# Patient Record
Sex: Female | Born: 1970 | Race: Black or African American | Hispanic: No | Marital: Married | Smoking: Former smoker
Health system: Southern US, Community
[De-identification: ages and names within clinical notes are randomized; demographics above are authoritative.]

## PROBLEM LIST (undated history)

## (undated) DIAGNOSIS — E119 Type 2 diabetes mellitus without complications: Secondary | ICD-10-CM

## (undated) DIAGNOSIS — S83209A Unspecified tear of unspecified meniscus, current injury, unspecified knee, initial encounter: Secondary | ICD-10-CM

## (undated) DIAGNOSIS — I10 Essential (primary) hypertension: Secondary | ICD-10-CM

---

## 1997-09-04 ENCOUNTER — Other Ambulatory Visit: Admission: RE | Admit: 1997-09-04 | Discharge: 1997-09-04 | Payer: Self-pay | Admitting: Obstetrics & Gynecology

## 1998-02-06 ENCOUNTER — Other Ambulatory Visit: Admission: RE | Admit: 1998-02-06 | Discharge: 1998-02-06 | Payer: Self-pay | Admitting: Obstetrics and Gynecology

## 1998-04-13 ENCOUNTER — Encounter: Admission: RE | Admit: 1998-04-13 | Discharge: 1998-07-12 | Payer: Self-pay | Admitting: Obstetrics and Gynecology

## 1998-05-16 ENCOUNTER — Observation Stay (HOSPITAL_COMMUNITY): Admission: RE | Admit: 1998-05-16 | Discharge: 1998-05-16 | Payer: Self-pay | Admitting: Obstetrics and Gynecology

## 1998-08-15 ENCOUNTER — Encounter: Payer: Self-pay | Admitting: Obstetrics and Gynecology

## 1998-08-15 ENCOUNTER — Inpatient Hospital Stay (HOSPITAL_COMMUNITY): Admission: AD | Admit: 1998-08-15 | Discharge: 1998-08-15 | Payer: Self-pay | Admitting: Obstetrics and Gynecology

## 1998-08-16 ENCOUNTER — Inpatient Hospital Stay (HOSPITAL_COMMUNITY): Admission: AD | Admit: 1998-08-16 | Discharge: 1998-08-16 | Payer: Self-pay | Admitting: Obstetrics and Gynecology

## 1998-08-30 ENCOUNTER — Inpatient Hospital Stay (HOSPITAL_COMMUNITY): Admission: AD | Admit: 1998-08-30 | Discharge: 1998-08-30 | Payer: Self-pay | Admitting: *Deleted

## 1998-09-04 ENCOUNTER — Inpatient Hospital Stay (HOSPITAL_COMMUNITY): Admission: AD | Admit: 1998-09-04 | Discharge: 1998-09-04 | Payer: Self-pay | Admitting: Obstetrics and Gynecology

## 1998-09-06 ENCOUNTER — Inpatient Hospital Stay (HOSPITAL_COMMUNITY): Admission: AD | Admit: 1998-09-06 | Discharge: 1998-09-09 | Payer: Self-pay | Admitting: Obstetrics and Gynecology

## 1998-09-11 ENCOUNTER — Encounter (HOSPITAL_COMMUNITY): Admission: RE | Admit: 1998-09-11 | Discharge: 1998-12-10 | Payer: Self-pay | Admitting: Obstetrics and Gynecology

## 1998-09-16 ENCOUNTER — Inpatient Hospital Stay (HOSPITAL_COMMUNITY): Admission: AD | Admit: 1998-09-16 | Discharge: 1998-09-16 | Payer: Self-pay | Admitting: Obstetrics and Gynecology

## 1998-11-06 ENCOUNTER — Other Ambulatory Visit: Admission: RE | Admit: 1998-11-06 | Discharge: 1998-11-06 | Payer: Self-pay | Admitting: Obstetrics and Gynecology

## 1998-11-07 ENCOUNTER — Encounter (INDEPENDENT_AMBULATORY_CARE_PROVIDER_SITE_OTHER): Payer: Self-pay

## 1998-11-07 ENCOUNTER — Other Ambulatory Visit: Admission: RE | Admit: 1998-11-07 | Discharge: 1998-11-07 | Payer: Self-pay | Admitting: Obstetrics and Gynecology

## 1999-06-06 ENCOUNTER — Inpatient Hospital Stay (HOSPITAL_COMMUNITY): Admission: AD | Admit: 1999-06-06 | Discharge: 1999-06-06 | Payer: Self-pay | Admitting: *Deleted

## 1999-10-30 ENCOUNTER — Other Ambulatory Visit: Admission: RE | Admit: 1999-10-30 | Discharge: 1999-10-30 | Payer: Self-pay | Admitting: Obstetrics and Gynecology

## 1999-12-04 ENCOUNTER — Encounter (INDEPENDENT_AMBULATORY_CARE_PROVIDER_SITE_OTHER): Payer: Self-pay | Admitting: Specialist

## 1999-12-04 ENCOUNTER — Other Ambulatory Visit: Admission: RE | Admit: 1999-12-04 | Discharge: 1999-12-04 | Payer: Self-pay | Admitting: Obstetrics and Gynecology

## 1999-12-11 ENCOUNTER — Inpatient Hospital Stay (HOSPITAL_COMMUNITY): Admission: AD | Admit: 1999-12-11 | Discharge: 1999-12-11 | Payer: Self-pay | Admitting: Obstetrics and Gynecology

## 1999-12-12 ENCOUNTER — Encounter (INDEPENDENT_AMBULATORY_CARE_PROVIDER_SITE_OTHER): Payer: Self-pay

## 1999-12-12 ENCOUNTER — Ambulatory Visit (HOSPITAL_COMMUNITY): Admission: RE | Admit: 1999-12-12 | Discharge: 1999-12-12 | Payer: Self-pay | Admitting: Obstetrics and Gynecology

## 1999-12-31 ENCOUNTER — Encounter (INDEPENDENT_AMBULATORY_CARE_PROVIDER_SITE_OTHER): Payer: Self-pay

## 1999-12-31 ENCOUNTER — Other Ambulatory Visit: Admission: RE | Admit: 1999-12-31 | Discharge: 1999-12-31 | Payer: Self-pay | Admitting: Obstetrics and Gynecology

## 2000-04-24 ENCOUNTER — Other Ambulatory Visit: Admission: RE | Admit: 2000-04-24 | Discharge: 2000-04-24 | Payer: Self-pay | Admitting: *Deleted

## 2000-09-24 ENCOUNTER — Other Ambulatory Visit: Admission: RE | Admit: 2000-09-24 | Discharge: 2000-09-24 | Payer: Self-pay | Admitting: Obstetrics and Gynecology

## 2001-05-05 ENCOUNTER — Other Ambulatory Visit: Admission: RE | Admit: 2001-05-05 | Discharge: 2001-05-05 | Payer: Self-pay | Admitting: Obstetrics and Gynecology

## 2001-05-07 ENCOUNTER — Encounter (INDEPENDENT_AMBULATORY_CARE_PROVIDER_SITE_OTHER): Payer: Self-pay

## 2001-05-07 ENCOUNTER — Ambulatory Visit (HOSPITAL_COMMUNITY): Admission: RE | Admit: 2001-05-07 | Discharge: 2001-05-07 | Payer: Self-pay | Admitting: Obstetrics and Gynecology

## 2002-02-10 HISTORY — PX: HERNIA REPAIR: SHX51

## 2002-02-10 HISTORY — PX: ABDOMINAL HYSTERECTOMY: SHX81

## 2002-03-19 ENCOUNTER — Encounter: Payer: Self-pay | Admitting: Obstetrics & Gynecology

## 2002-03-19 ENCOUNTER — Encounter (INDEPENDENT_AMBULATORY_CARE_PROVIDER_SITE_OTHER): Payer: Self-pay

## 2002-03-19 ENCOUNTER — Ambulatory Visit (HOSPITAL_COMMUNITY): Admission: AD | Admit: 2002-03-19 | Discharge: 2002-03-19 | Payer: Self-pay | Admitting: Obstetrics & Gynecology

## 2002-03-21 ENCOUNTER — Ambulatory Visit (HOSPITAL_COMMUNITY): Admission: AD | Admit: 2002-03-21 | Discharge: 2002-03-21 | Payer: Self-pay | Admitting: Obstetrics and Gynecology

## 2002-03-21 ENCOUNTER — Encounter: Payer: Self-pay | Admitting: Obstetrics and Gynecology

## 2002-03-22 ENCOUNTER — Encounter (INDEPENDENT_AMBULATORY_CARE_PROVIDER_SITE_OTHER): Payer: Self-pay | Admitting: *Deleted

## 2002-04-19 ENCOUNTER — Encounter (HOSPITAL_COMMUNITY): Admission: RE | Admit: 2002-04-19 | Discharge: 2002-07-18 | Payer: Self-pay | Admitting: Nephrology

## 2002-10-26 ENCOUNTER — Other Ambulatory Visit: Admission: RE | Admit: 2002-10-26 | Discharge: 2002-10-26 | Payer: Self-pay | Admitting: Obstetrics and Gynecology

## 2002-11-04 ENCOUNTER — Ambulatory Visit (HOSPITAL_COMMUNITY): Admission: RE | Admit: 2002-11-04 | Discharge: 2002-11-04 | Payer: Self-pay | Admitting: Obstetrics and Gynecology

## 2002-11-04 ENCOUNTER — Encounter (INDEPENDENT_AMBULATORY_CARE_PROVIDER_SITE_OTHER): Payer: Self-pay | Admitting: *Deleted

## 2003-01-16 ENCOUNTER — Encounter (INDEPENDENT_AMBULATORY_CARE_PROVIDER_SITE_OTHER): Payer: Self-pay | Admitting: *Deleted

## 2003-01-16 ENCOUNTER — Inpatient Hospital Stay (HOSPITAL_COMMUNITY): Admission: RE | Admit: 2003-01-16 | Discharge: 2003-01-19 | Payer: Self-pay | Admitting: Obstetrics and Gynecology

## 2003-03-23 ENCOUNTER — Emergency Department (HOSPITAL_COMMUNITY): Admission: AD | Admit: 2003-03-23 | Discharge: 2003-03-23 | Payer: Self-pay | Admitting: Family Medicine

## 2004-05-23 ENCOUNTER — Encounter: Admission: RE | Admit: 2004-05-23 | Discharge: 2004-08-21 | Payer: Self-pay | Admitting: Nephrology

## 2005-04-13 ENCOUNTER — Emergency Department (HOSPITAL_COMMUNITY): Admission: AD | Admit: 2005-04-13 | Discharge: 2005-04-13 | Payer: Self-pay | Admitting: Family Medicine

## 2005-10-18 ENCOUNTER — Emergency Department (HOSPITAL_COMMUNITY): Admission: EM | Admit: 2005-10-18 | Discharge: 2005-10-18 | Payer: Self-pay | Admitting: Emergency Medicine

## 2007-11-29 ENCOUNTER — Emergency Department (HOSPITAL_COMMUNITY): Admission: EM | Admit: 2007-11-29 | Discharge: 2007-11-29 | Payer: Self-pay | Admitting: Emergency Medicine

## 2007-12-06 ENCOUNTER — Encounter: Admission: RE | Admit: 2007-12-06 | Discharge: 2007-12-06 | Payer: Self-pay | Admitting: Internal Medicine

## 2008-04-28 ENCOUNTER — Encounter: Admission: RE | Admit: 2008-04-28 | Discharge: 2008-04-28 | Payer: Self-pay | Admitting: Obstetrics and Gynecology

## 2009-10-26 ENCOUNTER — Emergency Department (HOSPITAL_COMMUNITY): Admission: EM | Admit: 2009-10-26 | Discharge: 2009-10-26 | Payer: Self-pay | Admitting: Family Medicine

## 2010-06-28 NOTE — H&P (Signed)
NAME:  Tara Stevens, Tara Stevens                    ACCOUNT NO.:  [id number]   MEDICAL RECORD NO.:  [id number]                   PATIENT TYPE:  INP   LOCATION:  NA                                   FACILITY:  [facility]   PHYSICIAN:  Maxie Better, M.D.            DATE OF BIRTH:  1970/06/16   DATE OF ADMISSION:  01/16/2003  DATE OF DISCHARGE:                                HISTORY & PHYSICAL   CHIEF COMPLAINT:  Dysfunctional uterine bleeding/menometrorrhagia refractory  to hormone therapy, scheduled for total abdominal hysterectomy.   HISTORY OF PRESENT ILLNESS:  This is a 40 year old gravida 5, para 2-0-3-2,  married black female who is now being admitted for total abdominal  hysterectomy secondary to dysfunctional uterine bleeding and  menometrorrhagia refractory to hormonal therapy and D&C with hysteroscopy  x2.  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  The patient underwent a D&C hysteroscopy  on November 04, 2002.  Findings at the time were endometrial polyps and a  disordered proliferative endometrium. The patient has been tried on oral  contraception for bleeding control, progestational agent without much  success.  Ultrasound on August 11, 2002, showed the uterus to contain multiple  uterine fibroids, a 2.2 cm right simple ovarian cyst, and a normal left  ovary at that time.  The patient has had a previous D&C hysteroscopy with  removal of endometrial polyps in March 2003.  The patient has noted  irregular heavy vaginal bleeding.  Her flow would last greater than seven  days with associated large clots, minimal cramping.  The patient has had  some hot flashes.  The patient has now decided to  proceed with definitive  management.   PAST MEDICAL HISTORY:  Allergy to PENICILLIN.   Medical history:  Chronic hypertension, polycystic ovarian syndrome, iron-  deficiency anemia, nephrotic syndrome, uterine fibroids, history of  gestational diabetes x2, exogenous obesity.   Surgery:  Cesarean section x2, D&C hysteroscopy 2001, 2004, LEEP procedure  November 2001, D&E x2, 1993 and December 2000.  D&C in 1995, cerclage  placement 2000.   PAST OBSTETRICAL HISTORY:  C-section x2.  TAB x1.  MAB x1.  SAB x1.   GYNECOLOGIC HISTORY:  Uterine fibroids, anovulation requiring Clomid for  conception, severe cervical dysplasia 2001.  Polycystic ovarian syndrome,  for which the patient was placed on Glucophage.   MEDICATIONS:  Lasix, Lotensin, hydrochlorothiazide, Glucophage, iron,  vitamin B12, multivitamin.   FAMILY HISTORY:  Mother and maternal grandmother have passed.  No colon,  ovarian, or breast cancer noted.  Maternal aunt died of cervical cancer.  Mother had hypertension, as did maternal aunt, maternal great-grandmother,  and father.  MI in the mother and a maternal aunt.  Mother and maternal aunt  with cardiomyopathy.   SOCIAL HISTORY:  Married, two children, nonsmoker.  Self-employed.   REVIEW OF SYSTEMS:  Negative except for as per HPI.  Nipple discharge noted  in September 2004.   PHYSICAL EXAMINATION:  GENERAL:  A well-developed, well-nourished black  female in no acute distress.  VITAL SIGNS:  Blood pressure 144/98, temperature of 98.1, weight is 261  pounds.  Height 5 feet 5 inches.  SKIN:  No lesions.  HEENT:  Anicteric sclerae, pale pink conjunctivae.  Oropharynx negative.  CARDIAC:  Heart was regular rate and rhythm without murmur.  CHEST:  Lungs were clear to auscultation.  BREASTS:  Soft, nontender, no palpable mass.  NECK:  Supple, thyroid not palpable.  Posterior neck with acanthosis  nigricans.  ABDOMEN:  Obese, soft, no hernia noted.  Transverse incision.   Pannus.  No  organomegaly.  BACK:  No CVA tenderness.  LYMPHATIC:  No supraclavicular, axillary, or inguinal nodes palpable.  PELVIC:  Vulva shows no lesions.  Vagina with a relaxed introitus.  Cervix  was parous, high in the vault.  Uterus is bulky, anteverted, about 12 weeks'  size.  Adnexa not palpable.  Limited by the patient's body habitus.  RECTAL:  Deferred.  EXTREMITIES:  1+ edema.   LABORATORY DATA:  Pap in September 2004 was within normal limits.   IMPRESSION:  1. Dysfunctional uterine bleeding refractory to hormonal therapy and     surgical management.  2. Chronic hypertension.  3. Nephrotic syndrome.  4. Exogenous obesity.   PLAN:  Admission for total abdominal hysterectomy with preservation of  ovarian function.  Antibiotic prophylaxis, antiembolic stockings.  Risks of  the procedure have been explained to the patient, including but not limited  to infection, bleeding, injury to surrounding organ structures such as the  bladder, bowel, or ureter, fistula formation, possible need for ovarian  surgery in the future secondary to ovarian cyst, ovarian cancer, etc.,  internal scar tissue which could lead to bowel obstruction, and pelvic pain.  Risks of blood transfusion including HIV transfusion, hepatitis  transmission, acute reaction were discussed.  Need for continued Pap smears  despite removal of the uterus was discussed.  Postop care and criteria for  discharge were reviewed.  Will continue antihypertensive medications  postoperatively.  All questions answered.                                               Maxie Better, M.D.    [place]/MEDQ  D:  01/15/2003  T:  01/16/2003  Job:  [id number]

## 2010-06-28 NOTE — Op Note (Signed)
[name], Tara Stevens                    ACCOUNT NO.:  [id number]   MEDICAL RECORD NO.:  [id number]                   PATIENT TYPE:  AMB   LOCATION:  SDC                                  FACILITY:  [facility]   PHYSICIAN:  Maxie Better, M.D.            DATE OF BIRTH:  15-Jun-1970   DATE OF PROCEDURE:  11/04/2002  DATE OF DISCHARGE:                                 OPERATIVE REPORT   PREOPERATIVE DIAGNOSES:  1. Dysfunctional uterine bleeding.  2. Question submucosal fibroid.   PROCEDURES:  1. Diagnostic hysteroscopy.  2. Resection of endometrial polyps.  3. Dilation and curettage.   POSTOPERATIVE DIAGNOSES:  1. Dysfunctional uterine bleeding.  2. Endometrial polyps.   ANESTHESIA:  General.   SURGEON:  Maxie Better, M.D.   INDICATIONS:  This is a 40 year old female with anovulatory cycles who has  prolonged and heavy menses and who on ultrasound was found to have a  possible submucosal mass and who now presents for surgical management.  The  patient does desire to conceive.  The risks and benefits of the procedure  had been explained to the patient, consent was signed, and the patient was  transferred to the operating room.   DESCRIPTION OF PROCEDURE:  Under adequate general anesthesia, the patient  was placed in the dorsal lithotomy position.  She was sterilely prepped and  draped in the usual fashion.  The bladder was catheterized for a large  amount of urine.  Examination under anesthesia revealed a 12-week size  anteverted uterus, no adnexal masses were appreciated; however, the exam was  limited by the patient's body habitus.  A bivalve speculum was placed in the  vagina, a single-tooth tenaculum was placed on the anterior lip of the  cervix.  The cervix was then serially dilated up to a #31 Pratt dilator.  A  glycine-primed resectoscope with a double loop was introduced into the  uterine cavity without incident.  A large amount of polypoid lesions were  noted on the right, left, fundal, and posterior aspects of the endometrial  cavity.  The resectoscope was then used to resect the polyps and the  resectoscope was then removed.  The cavity was then curetted, resectoscope  reintroduced.  Additional polyps that were noted, particularly on the left  anterior aspect of the uterine cavity, were also resected.  During this time  the right tubal ostium was seen, the left could not be seen.  There was no  evidence or suggestion of a submucosal fibroid.  The endocervical canal was  subsequently inspected and no polypoid lesions noted.  With the cavity  showing no other polypoid lesions, the resectoscope was removed, the cavity  was not curetted further.  Specimen labeled endometrial  curetting and endometrial polyps was sent to pathology.  Estimated blood  loss was minimal.  Fluid deficit was 330 mL.  Complication was none.  The  patient tolerated the procedure well and was transferred to the recovery  room in stable condition.                                               Maxie Better, M.D.    [place]/MEDQ  D:  11/04/2002  T:  11/07/2002  Job:  [id number]

## 2010-06-28 NOTE — Op Note (Signed)
NAMEDON, GIARRUSSO                    ACCOUNT NO.:  [id number]   MEDICAL RECORD NO.:  [id number]                   PATIENT TYPE:  INP   LOCATION:  9302                                 FACILITY:  [facility]   PHYSICIAN:  Maxie Better, M.D.            DATE OF BIRTH:  08/16/1970   DATE OF PROCEDURE:  01/16/2003  DATE OF DISCHARGE:                                 OPERATIVE REPORT   PREOPERATIVE DIAGNOSES:  1. Dysfunctional uterine bleeding/menometrorrhagia.  2. Uterine fibroids.   PROCEDURES:  1. Exploratory laparotomy.  2. Total abdominal hysterectomy.  3. Bilateral inguinal herniorrhaphy.   POSTOPERATIVE DIAGNOSES:  1. Dysfunctional uterine bleeding/menorrhagia.  2. Uterine fibroids.  3. Bilateral inguinal hernias.   ANESTHESIA:  General.   SURGEON:  Maxie Better, M.D.   ASSISTANT:  Pershing Cox, M.D.   DESCRIPTION OF PROCEDURE:  Under adequate general anesthesia, the patient  was placed in the supine position.  She was sterilely prepped and draped in  the usual fashion for a hysterectomy.  An indwelling Foley catheter was  placed.  The patient's pannus was pulled upward.  Marcaine 0.25% was  injected along the previous Pfannenstiel skin incision.  A Pfannenstiel skin  incision was then made, carried down to the rectus fascia.  The rectus  fascia was incised in the midline and extended bilaterally.  The  architecture of the rectus fascia was distorted due to two previous  surgeries.  Nonetheless, after careful dissection the rectus muscle was  dissected off  the rectus fascia transversely and inferiorly.  At the time  of the dissection inferiorly, the left inguinal canal was noted to have a  bulge consistent with an inguinal hernia.  The parietal peritoneum was  incidentally opened as part of the dissection of the rectus fascia  superiorly, and this was then utilized to further carefully dissect  inferiorly and superiorly.  The upper abdomen was  inspected, a normal liver  edge palpable, and normal kidneys palpable.  A self-retaining Balfour  retractor was then placed.  The bowel was packed upwardly.  The uterus was  placed on traction.  The uterus was noted to be about 12 weeks' size.  Both  ovaries were enlarged but otherwise normal.  Both tubes were normal.  There  were small adhesions of the vesicouterine peritoneum on the right.  The  round ligament was bilaterally suture ligated with 0 Vicryl suture and  severed using cautery.  The proximal portions of the round ligament  bilaterally were suture ligated for hemostasis.  The anterior leaf of the  broad ligament was then opened transversely.  The bladder was then bluntly  and sharply dissected off the lower uterine segment and displaced  inferiorly.  The posterior leaf of the broad ligament was opened on the  right.  The utero-ovarian ligament on the right was doubly clamped, cut,  free-tied with 0 Vicryl, and suture ligated with 0 Vicryl suture.  The right  uterine vessels  were then skeletonized.  The uterine vessels on the right  were then doubly clamped, cut, and suture ligated with 0 Vicryl x2.  The  same procedure was performed on the contralateral side.  The cardinal  ligaments were then bilaterally serially clamped, cut, and suture ligated  with 0 Vicryl.  The uterosacral ligaments were identified and bilaterally  clamped, cut, and suture ligated with 0 Vicryl.  This procedure was  continued until the cervicovaginal junction was then reached, at which time  the right angle of the cervicovaginal junction was then clamped, cut,  confirming entry into the vagina, and severing of the cervical attachment to  the vagina.  The remaining portion of the attached cervix was then  circumferentially removed.  The right angle was then suture ligated with a  Heaney suture of 0 Vicryl.  The left angle was then angled with 0 Vicryl  using a modified Richardson stitch.  The vaginal cuff  was then reefed with 0  Vicryl running locked stitch circumferentially.  The posterior aspect of the  vaginal cuff was then also approximated with 2-0 Vicryl suture due to  peritoneal disruption posteriorly.  The bladder had continued to be  displaced inferiorly and small bleeders cauterized.  The vaginal cuff was  then closed an interrupted 0 Vicryl figure-of-eight sutures in a vertical  fashion.  Both ureters were identified and noted to be peristalsing and  normal in caliber with good hemostasis noted and all pedicles identified to  be okay.  The uterosacral ligaments were then attached to the angle sutures  bilaterally.  The round ligament was then used to suspend the ovarian  pedicles bilaterally.  The abdomen was then further inspected and irrigated,  suctioned of debris.  Good hemostasis was noted throughout.  The self-  retaining Balfour retractor and the bladder blade were removed, and  attention was then turned to the inguinal area.  Both inguinal canals were  inspected.  The left was larger than the right.  The defect in the inguinal  canal was approximated bilaterally using 0 Novofil sutures, interrupted  figure-of-eights.  At that point all packings were then removed from the  abdomen.  Bleeders were cauterized.  The parietal peritoneum was not closed.  The rectus fascia was closed with 0 Vicryl x2.  The subcutaneous area was  irrigated, suctioned, and bleeders cauterized.  The subcutaneous area was  approximated with interrupted 3-0 plain sutures.  The skin was approximated  using Ethicon staples.   SPECIMENS:  Uterus and cervix.   ESTIMATED BLOOD LOSS:  200 mL.   INTRAOPERATIVE FLUIDS REPLACED:  2400 mL crystalloid.   URINE OUTPUT:  150 mL clear yellow urine.   Sponge and instrument count x2 was correct.   COMPLICATIONS:  None.   The patient tolerated the procedure well, was transferred to the recovery  room in stable condition.                                               Maxie Better, M.D.    [place]/MEDQ  D:  01/16/2003  T:  01/17/2003  Job:  [id number]

## 2010-06-28 NOTE — Discharge Summary (Signed)
NAMECHANDELL, ATTRIDGE                    ACCOUNT NO.:  [id number]   MEDICAL RECORD NO.:  [id number]                   PATIENT TYPE:  INP   LOCATION:  9302                                 [facility]ACILITY:  [facility]   PHYSICIAN:  Maxie Better, M.D.            DATE OF BIRTH:  1970-09-07   DATE OF ADMISSION:  01/16/2003  DATE OF DISCHARGE:  01/19/2003                                 DISCHARGE SUMMARY   ADMISSION DIAGNOSES:  1. Dysfunction uterine bleeding.  2. Chronic hypertension.  3. Nephrotic syndrome.  4. Exogenous obesity.  5. Uterine fibroids.   DISCHARGE DIAGNOSES:  1. Dysfunction uterine bleeding.  2. Extensive adenomyosis.  3. Uterine fibroids.  4. Bilateral inguinal hernias.   PROCEDURES:  1. Total abdominal hysterectomy.  2. Bilateral inguinal herniorrhaphy.   HISTORY OF PRESENT ILLNESS:  This is a 40 year old, gravida 5, para 2-0-3-2,  married black female admitted for a total abdominal hysterectomy secondary  to dysfunctional uterine bleeding refractory to hormonal therapy, and D&C  with hysteroscopy x2.  Please see the dictated H&P for the specific details.   HOSPITAL COURSE:  The patient was admitted to [facility].  She was  taken to the operating room where she underwent a total abdominal  hysterectomy.  At the time of the surgery, she was found to have bilateral  inguinal hernias.  Her uterus was about 12 weeks size.  Large ovaries were  noted bilaterally.  Normal tubes were otherwise noted.  Normal upper  abdomen.  The final pathology revealed extensive adenomyosis, intramural  leioma of 1 cm, subserosal leiomyoma of 0.8 cm.  Her postoperative course  was complicated by a postoperative temperature of a maximum of 100.7  secondary to atelectasis.  Urinalysis and urine cultures were obtained.  Urine culture showed multiple species.  A CBC on postoperative day #1 showed  a hemoglobin of 10.2, hematocrit of 31.2, white count of 8.3.  The patient  was given  Dulcolax suppository for slow bowel function.  By postoperative  day #3, the patient had been afebrile for more than 24 hours, she had  tolerated a regular diet, she had passed flatus, her incision showed no  erythema, induration, or exudate, and the staples were in place.  The  patient was anxious to go home, and she was deemed well to be discharged.   DISPOSITION:  Home.   CONDITION:  Stable.   DISCHARGE MEDICATIONS:  1. Tylox, #30, 1-2 tablets q.3-4h. p.r.n. pain.  2. The patient was to continue her antihypertensive medication.   DISCHARGE TEACHING:  1. Call for temperature greater than or equal to 100.4.  2. Nothing per vagina for 4-6 weeks.  3. No heavy lifting.  4. No driving for 2 weeks.  5. Call for increased incisional pain, drainage from the incision site,     ____________ more frequently.  6. No straining with her bowel movements.   FOLLOW UP:  1. Follow up appointment is with OB/GYN in  4-6 weeks for routine     postoperative care.  2. With __________ on January 23, 2003.                                               Maxie Better, M.D.    /MEDQ  D:  03/03/2003  T:  03/04/2003  Job:  [id number]

## 2010-06-28 NOTE — Op Note (Signed)
[facility]  Patient:    Tara Stevens, Tara Stevens Visit Number: [id number] MRN: [id number]          Service Type: DSU Location: [facility] Attending Physician:  Maxie Better Dictated by:   Sheria Lang. Cherly Hensen, M.D. Proc. Date: 05/07/01 Admit Date:  05/07/2001 Disc. Date: 05/09/01                             Operative Report  PREOPERATIVE DIAGNOSES:       Dysfunctional uterine bleeding, endometrial mass.  PROCEDURE:                    Diagnostic hysteroscopy, resection of possible endometrial polyps, dilatation and curettage.  POSTOPERATIVE DIAGNOSES:      Dysfunctional uterine bleeding, possible endometrial polyps, adenomyosis.  ANESTHESIA:                   General.  SURGEON:                      Sheronette A. Cherly Hensen, M.D.  HISTORY:                      This is a 40 year old para 2 female with history of prolonged anovulatory bleeding resulting in severe iron deficiency anemia who on sonohysterogram was found to have an endometrial mass and who now presents for further evaluation.  Risks and benefits of procedure have been explained to the patient.  Consent was signed.  The patient had had a previous D&C, hysteroscopic removal of endometrial polyp approximately two years ago in a similar setting.  PROCEDURE:                    Under adequate general anesthesia the patient was placed in the dorsal lithotomy position.  Examination under anesthesia revealed a bulky anteverted 10 week sized uterus.  No adnexal masses could be appreciated.  The patient was sterilely prepped and draped in the usual fashion.  The bladder was catheterized for a small amount of urine.  Bivalve speculum was placed in the vagina.  Single tooth tenaculum was placed in the anterior lip of the cervix.  The cervix was easily dilated up to a #31 Pratt dilator.  A resectoscope primed with fluid was introduced into the uterine cavity without difficulty.  Inspection of the  cavity was notable for both tubal ostia being seen.  There was a lot of polypoid looking tissue fundally, posteriorly on the left and on the right anterior surface of the uterus. There was also some gland openings noted on the fundal area suggestive of adenomyosis and less likely scarring, although the patient has had previous cesarean section.  Using the resectoscope the polypoid masses were removed. The resectoscope was removed, cavity curetted, and resectoscope reinserted. The cavity was inspected and additional polypoid lesions were then gently removed.  Cavity once again curetted.  The endocervical canal was inspected. No polypoid masses noted at which time the cavity felt slightly irregular anteriorly.  There was no evidence of a submucosal fibroid that could be visualized.  Decision was then made to terminate the procedure by removing all instruments from the vagina.  Specimen labeled endometrial curetting and endometrial polyp was sent to pathology.  Estimated blood loss was minimal. Complications were none.  The patient tolerated the procedure well.  Was transferred to the recovery room in stable condition. Dictated by:   Nena Jordan  Cathie Beams, M.D. Attending Physician:  Maxie Better DD:  05/07/01 TD:  05/09/01 Job: [id number]

## 2010-06-28 NOTE — Op Note (Signed)
   [name], Tara Stevens                    ACCOUNT NO.:  [id number]   MEDICAL RECORD NO.:  [id number]                   PATIENT TYPE:  AMB   LOCATION:  MATC                                 FACILITY:  [facility]   PHYSICIAN:  Lenoard Aden, M.D.             DATE OF BIRTH:  10/21/1970   DATE OF PROCEDURE:  03/22/2002  DATE OF DISCHARGE:                                 OPERATIVE REPORT   PREOPERATIVE DIAGNOSIS:  Retained products of conception status post  dilatation and evacuation on March 19, 2002.   POSTOPERATIVE DIAGNOSIS:  Retained products of conception status post  dilatation and evacuation on March 19, 2002.   PROCEDURE:  Suction dilatation and evacuation.   SURGEON:  Lenoard Aden, M.D.   ANESTHESIA:  MAC, paracervical.   ESTIMATED BLOOD LOSS:  100 mL.   COMPLICATIONS:  None.   DRAINS:  None.   COUNTS:  Correct.   DISPOSITION:  The patient was brought to the recovery room in good  condition.   DESCRIPTION OF PROCEDURE:  After achieving adequate anesthesia, the patient  was brought to the operating room where she was administered IV sedation,  prepped and draped in the usual sterile fashion.  Examination under  anesthesia reveals an 8-10 week size uterus, anterior fibroid palpable after  placement of 2% Xylocaine solution for paracervical block, 20 mL.  The  uterus sounds up to 15 cm, deviated to the right.  A 8 mm suction curet was  placed after dilating the uterus with a 25 Pratt dilator.  Suction reveals  adequate tissue and palpable products of conception.  Repeat suction and  curettage confirms curettage in a four quadrant method and reveals the  catheter to empty.  Cefotan 2 g given IM during the procedure.  Good  hemostasis is noted.  All instruments were removed.  The patient tolerated  the procedure well and went to recovery in good condition.                                                Lenoard Aden, M.D.    [facility]/MEDQ  D:   03/22/2002  T:  03/22/2002  Job:  [id number]

## 2010-06-28 NOTE — Op Note (Signed)
[facility]  Patient:    Tara Stevens, Tara Stevens                 MRN: [id number] Proc. Date: 12/12/99 Adm. Date:  [id number] Disc. Date: [id number] Attending:  Silverio Lay A                           Operative Report  PREOPERATIVE DIAGNOSES:       Endometrial polyp, anovulatory bleeding, severe iron deficiency anemia.  PROCEDURE:                    Diagnostic hysteroscopy with resection of endometrial polyp, dilation and curettage.  POSTOPERATIVE DIAGNOSES:      Endometrial polyp, anovulatory bleeding, severe iron deficiency anemia.  ANESTHESIA:                   General.  SURGEON:                      Sheronette A. Cherly Hensen, M.D.  INDICATIONS:                  This is a 40 year old para 2 female with the last normal menstrual period in April 2001 who had prolonged and excessive bleeding in July for a month and who on evaluation was found to have an endometrial mass in the fundal area and evaluation that also revealed a hemoglobin of 6.7.  The patient now presents for D&C, hysteroscopy, and removal of endometrial polyp.  She had been given 2 units of packed red cells on December 11, 1999.  Her hemoglobin rose to 8.1.  Her anemia work-up confirmed iron deficiency anemia with a ferritin level of 5.  The patients symptoms have been some lightheadedness and headache which she thought was sinus related.  Risks and benefits of procedure have been explained to the patient and her husband.  Consent was signed.  Patient was transferred to the operating room.  PROCEDURE:                    Under adequate general anesthesia the patient was placed in the dorsal lithotomy position.  Examination under anesthesia revealed a uterus that was about 10 weeks size.  No adnexal masses could be appreciated.  The patient was sterilely prepped and draped in the usual fashion.  The bladder was catheterized for a moderate amount of urine. Vivelle speculum was placed in the  vagina.  Single tooth tenaculum was placed on the anterior lip of the cervix.  The cervix accepted a 25 Pratt dilator.  A diagnostic hysteroscope to which a video camera was attached was introduced into the uterine cavity.  Inspection of the cavity was notable for both tubal ostia being seen, a polypoid mass arising from the fundus, and a very thickened and polypoidish posterior uterine wall.  No endocervical canal lesions noted.  The diagnostic hysteroscope was removed.  The cavity was curetted for a large amount of tissue.  Reinsertion of the diagnostic hysteroscope was noted for still polypoid lesions remaining, however, inadequate distention of the uterine cavity resulted in the diagnostic hysteroscope being removed.  A #31 Pratt dilator easily went into the cervical os and a resectoscope was inserted and the cavity was inspected.  Areas of thickening and what appears to be endometrial polyps were removed.  The resectoscope was removed, cavity recuretted, and reexamined.  When all polypoid appearing masses and thickening had  been removed without creating scarring in the uterine cavity, the instruments were all removed from the vagina.  SPECIMEN:                     Endometrial curetting with polyp.  ESTIMATED BLOOD LOSS:         Minimal.  COMPLICATIONS:                None.                                The patient tolerated procedure well and was transferred to the recovery room in good condition. DD:  12/12/99 TD:  12/13/99 Job: [id number]

## 2010-06-28 NOTE — Op Note (Signed)
NAMEELIZEBETH, Tara Stevens                    ACCOUNT NO.:  [id number]   MEDICAL RECORD NO.:  [id number]                   PATIENT TYPE:  AMB   LOCATION:  SDC                                  FACILITY:  [facility]   PHYSICIAN:  Genia Del, M.D.             DATE OF BIRTH:  October 12, 1970   DATE OF PROCEDURE:  03/19/2002  DATE OF DISCHARGE:                                 OPERATIVE REPORT   PREOPERATIVE DIAGNOSES:  1. Missed abortion, eight plus weeks' gestation per ultrasound, fetal heart     rate negative.  2. History of repetitive missed abortion.  3. Uterine myomas.   POSTOPERATIVE DIAGNOSES:  1. Missed abortion, eight plus weeks' gestation per ultrasound, fetal heart     rate negative.  2. History of repetitive missed abortion.  3. Uterine myomas.   PROCEDURE:  Dilatation and evacuation.   SURGEON:  Genia Del, M.D.   ANESTHESIOLOGIST:  Burnett Corrente, M.D.   DESCRIPTION OF PROCEDURE:  Under MAC analgesia, the patient was in lithotomy  position.  She is prepped Hibiclens on the suprapubic, vulvar, and vaginal  areas, the bladder is catheterized, and the patient is draped as usual.  The  vaginal exam revealed an irregular, increased-volume anteverted uterus,  mobile, no adnexal mass.  The cervix is closed, no active bleeding.  The  speculum is inserted.  The paracervical block is done with lidocaine 1% a  total of 20 mL at 4 o'clock and 8 o'clock.  We then grasp the anterior lip  of the cervix with a tenaculum.  Dilatation is done with Hegar dilators up  to #31 without difficulty.  We then use a #8 curved suction curette.  Products of conception are brought out corresponding to about eight to nine  weeks' gestation.  We then use a #9 curved curette to assure better  evacuation of the intrauterine cavity, which is slightly larger than average  probably due to uterine myomas.  We then use the sharp curette to  symmetrically curettage the entire intrauterine cavity on  all surfaces.  The  uterine sound is heard all over.  The uterus contracts well by the end of  the intervention.  The estimated blood loss was 50 mL.  The instruments were  removed.  A vaginal exam after surgery revealed a well-contracted uterus,  good hemostasis.  No complication occurred, and the patient was transferred  to recovery room in good status.  Her blood group was Rh positive.  Note  that part of the specimen was sent for genetic testing because of repetitive  missed abortion.  The rest were sent to pathology.                                                  Genia Del, M.D.    ML/MEDQ  D:  03/20/2002  T:  03/20/2002  Job:  [id number]

## 2010-06-28 NOTE — H&P (Signed)
Tara Stevens, Tara Stevens                    ACCOUNT NO.:  [id number]   MEDICAL RECORD NO.:  [id number]                   PATIENT TYPE:  AMB   LOCATION:  SDC                                  FACILITY:  [facility]   PHYSICIAN:  Maxie Better, M.D.            DATE OF BIRTH:  25-Mar-1970   DATE OF ADMISSION:  11/04/2002  DATE OF DISCHARGE:                                HISTORY & PHYSICAL   CHIEF COMPLAINT:  Endometrial mass, menometrorrhagia.   HISTORY OF PRESENT ILLNESS:  This is a 40 year old gravida 5, para 2-0-3-2,  married black female, last menstrual period of September 23, 2002, with known  uterine fibroids, who is now admitted for a D&C hysteroscopy and resection  of a possible submucosal fibroid.  The patient has known history of  anovulation with heavy associated bleeding, which in the past has caused  severe iron-deficiency anemia and necessitated transfusion.  The patient has  had cycles that have lasted about two weeks.  She does desire to conceive.  Ultrasound on August 11, 2002, had revealed multiple uterine fibroids and a 1.6  cm hypoechoic mass thought to be a possible submucosal fibroid.   History is noted for a D&C hysteroscopy in November 2001 secondary to severe  iron-deficiency anemia, endometrial polyps, and anovulation.  The patient's  history is notable also for nephrotic syndrome, chronic hypertension  controlled on medication, previous cesarean section x2, and a history of  gestational diabetes x2.   PAST MEDICAL HISTORY:  Allergies to PENICILLIN.   Medicines are Lotensin, iron supplementation.   Medical history is chronic hypertension, nephrotic syndrome followed by  Dyke Maes, M.D., iron-deficiency anemia, dysfunctional uterine  bleeding, uterine fibroids, anovulation.   PAST SURGICAL HISTORY:  C-section  x2, LEEP procedure in November 2001, D&E  x2 in 1993 and 2000, D&C in 1995, [facility] hysteroscopy 2001, cerclage placement  in 2000.   PAST  OBSTETRICAL HISTORY:  C-section  x2 with gestational diabetes.  TAB x1.  MAB x1.  SAB x1.   GYNECOLOGIC HISTORY:  Severe cervical dysplasia in 2001.  Uterine fibroids.  Anovulation with Clomid conception in the past. Cervical incompetence.   FAMILY HISTORY:  Mother deceased, grandmother deceased.  No mental  retardation, cystic fibrosis, or genetic abnormality.   SOCIAL HISTORY:  Married, homemaker, two children.   REVIEW OF SYSTEMS:  All systems negative except for genitourinary and  complaint of nipple discharge x2 weeks.   PHYSICAL EXAMINATION:  GENERAL:  A well-developed, well-nourished, obese  black female in no acute distress.  VITAL SIGNS:  Blood pressure 122/84, weight of 259 pounds, height of 5 feet  5 inches.  SKIN:  No lesions.  HEENT:  Pale pink conjunctivae, oropharynx negative.  CARDIAC:  Regular rate and rhythm without murmur.  CHEST:  Lungs are clear to auscultation.  NECK:  Supple, no palpable thyroid, acanthosis, 9 g in the posterior nape.  BREASTS:  Pedunculated, soft, nontender, no palpable mass.  No  nipple  discharge noted.  BACK:  No CVA tenderness.  ABDOMEN:  Obese, soft, nontender, no organomegaly, low transverse scar.  NODES:  No supraclavicular, axillary, or inguinal nodes palpable.  PELVIC:  Vulva:  No lesions.  Vagina:  Relaxed introitus, positive white  discharge.  Cervix was parous, high in the vault.  Uterus was bulky,  anteverted, about 10 weeks' size.  Adnexa:  No palpable mass, but limited by  the patient's body habitus.  RECTAL:  Deferred.  EXTREMITIES:  No edema.   IMPRESSION:  1. Endometrial mass.  2. Menometrorrhagia.   PLAN:  Admission, D&C hysteroscopy, resection of mass if present.  Routine  admission labs.  Risks of procedure have been explained to the patient.  The  risks included infection, bleeding, uterine perforation, fluid overload and  its management, injury to surrounding organ structures, terminal injury,  possible need for  surgery in two stages.  All questions answered.                                                Maxie Better, M.D.    [place]/MEDQ  D:  11/01/2002  T:  11/01/2002  Job:  [id number]

## 2011-07-25 IMAGING — CR DG KNEE COMPLETE 4+V*R*
4 series · 4 of 4 positions shown · non-contrast
Comparison: None.

CLINICAL DATA: Right knee pain

RIGHT KNEE - COMPLETE 4+ VIEW

[view not recorded (1 of 4)]
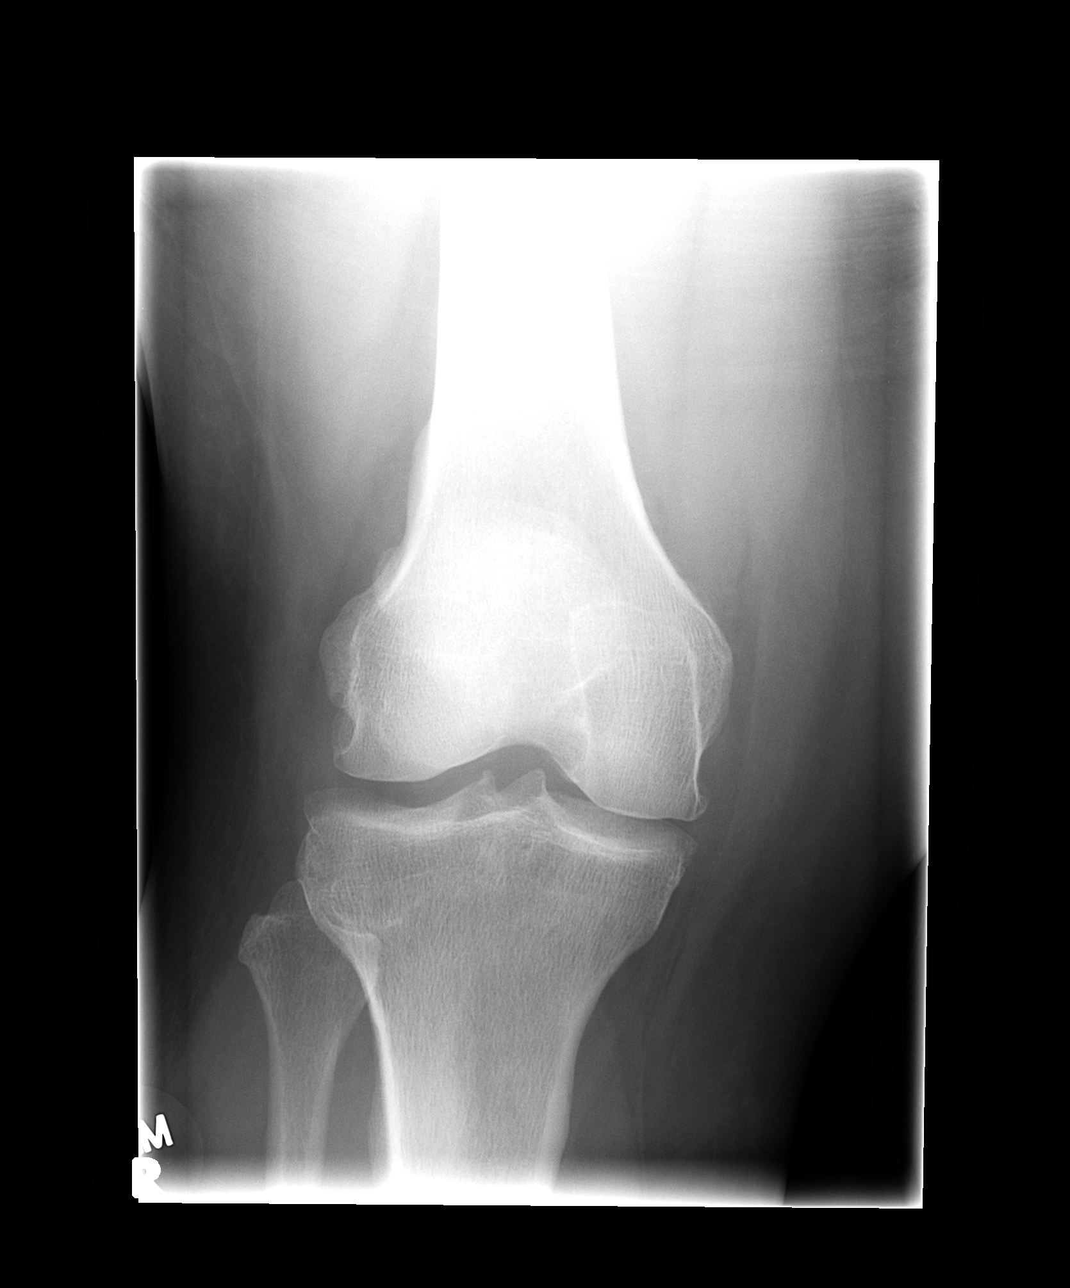

[view not recorded (2 of 4)]
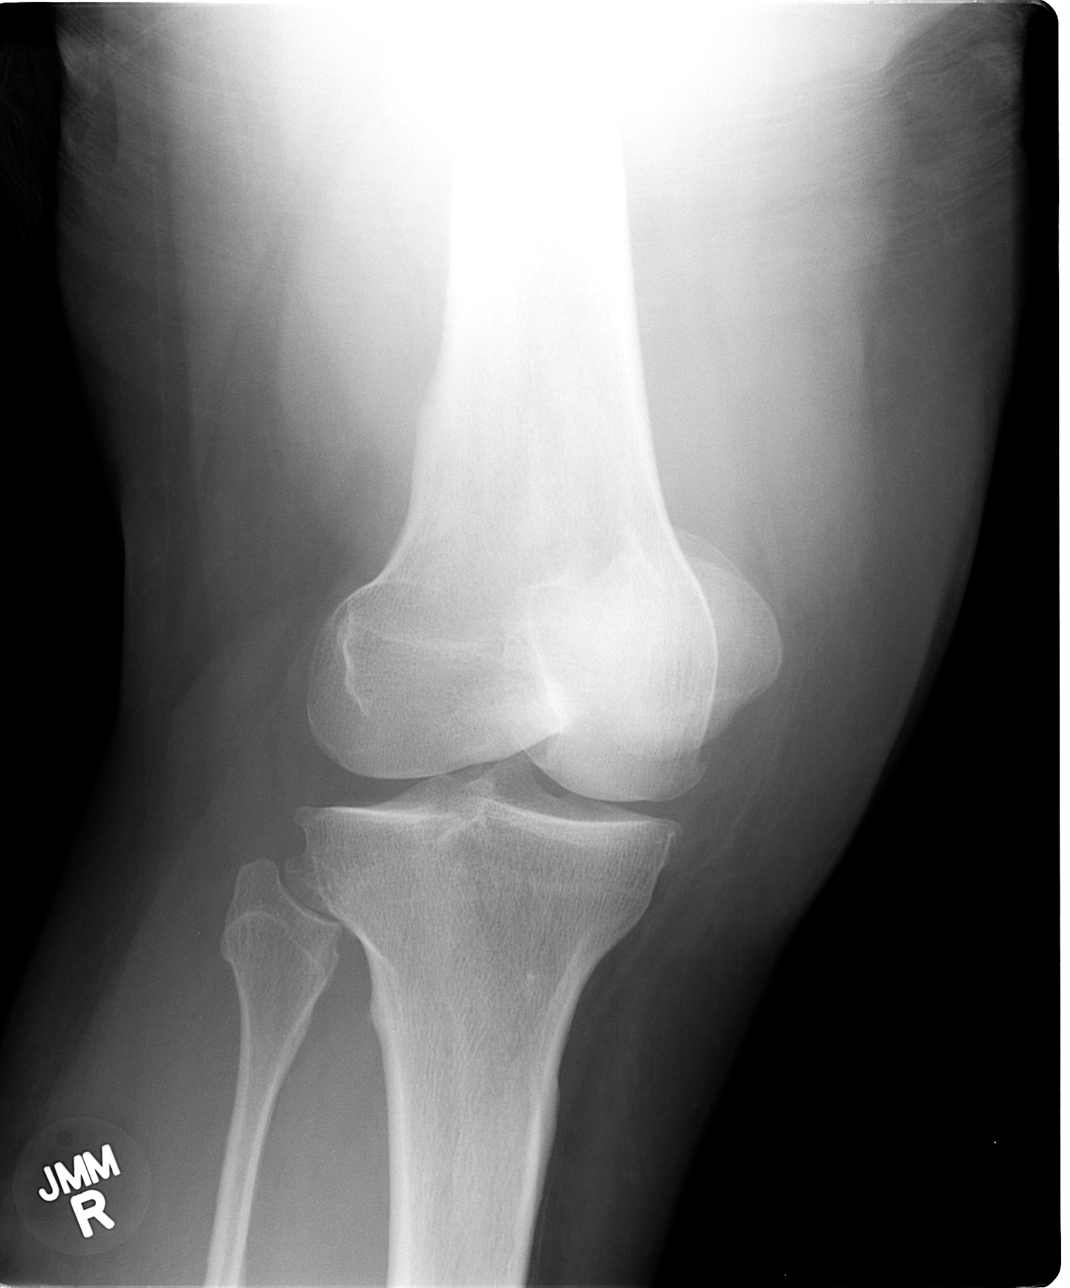

[view not recorded (3 of 4)]
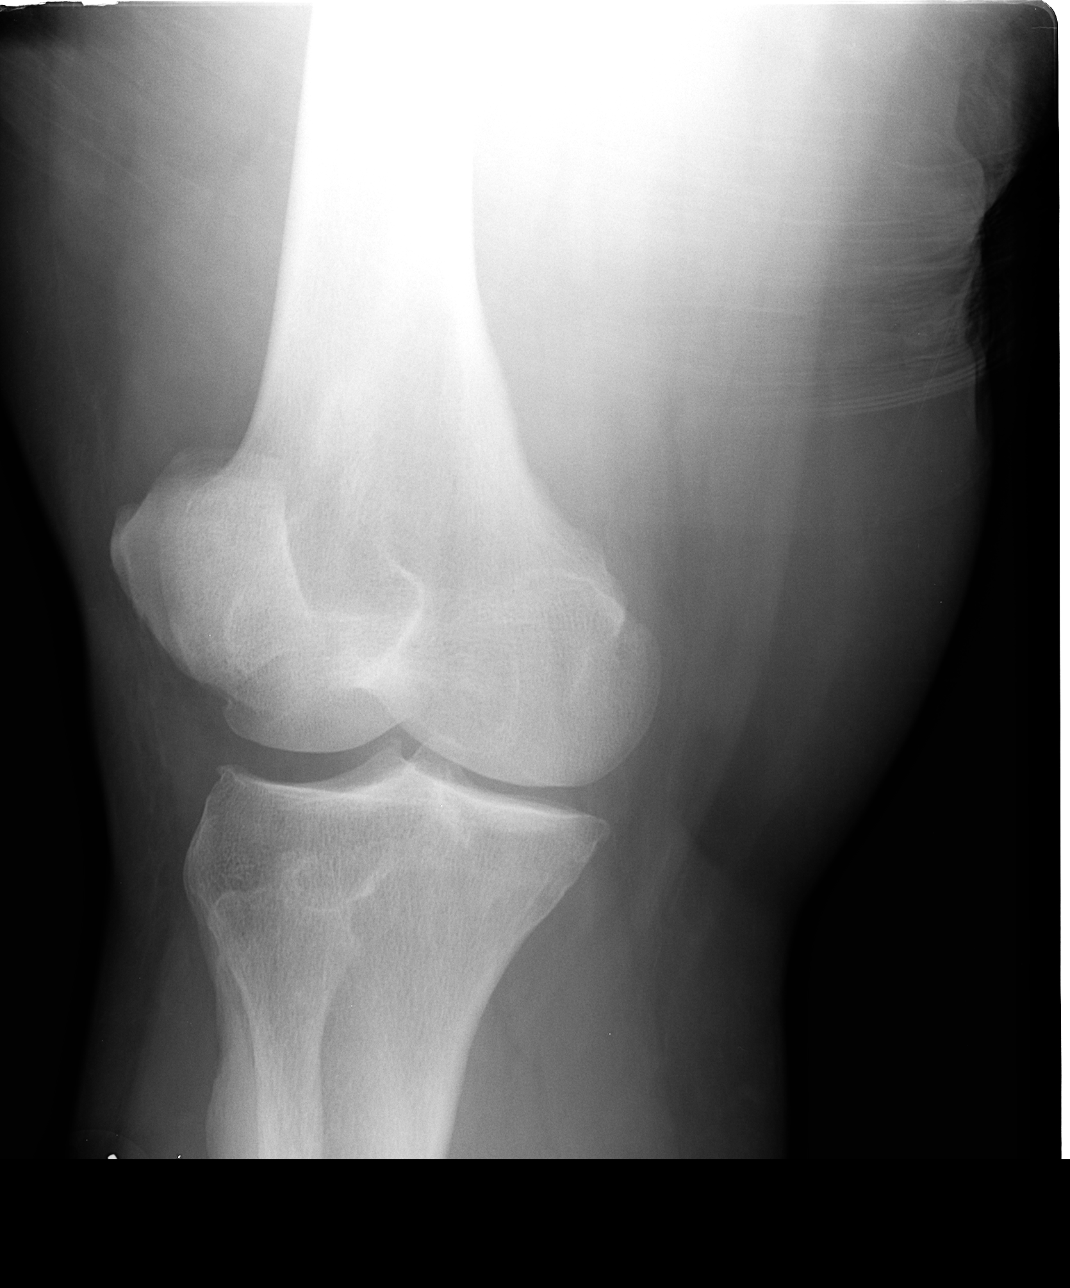

[view not recorded (4 of 4)]
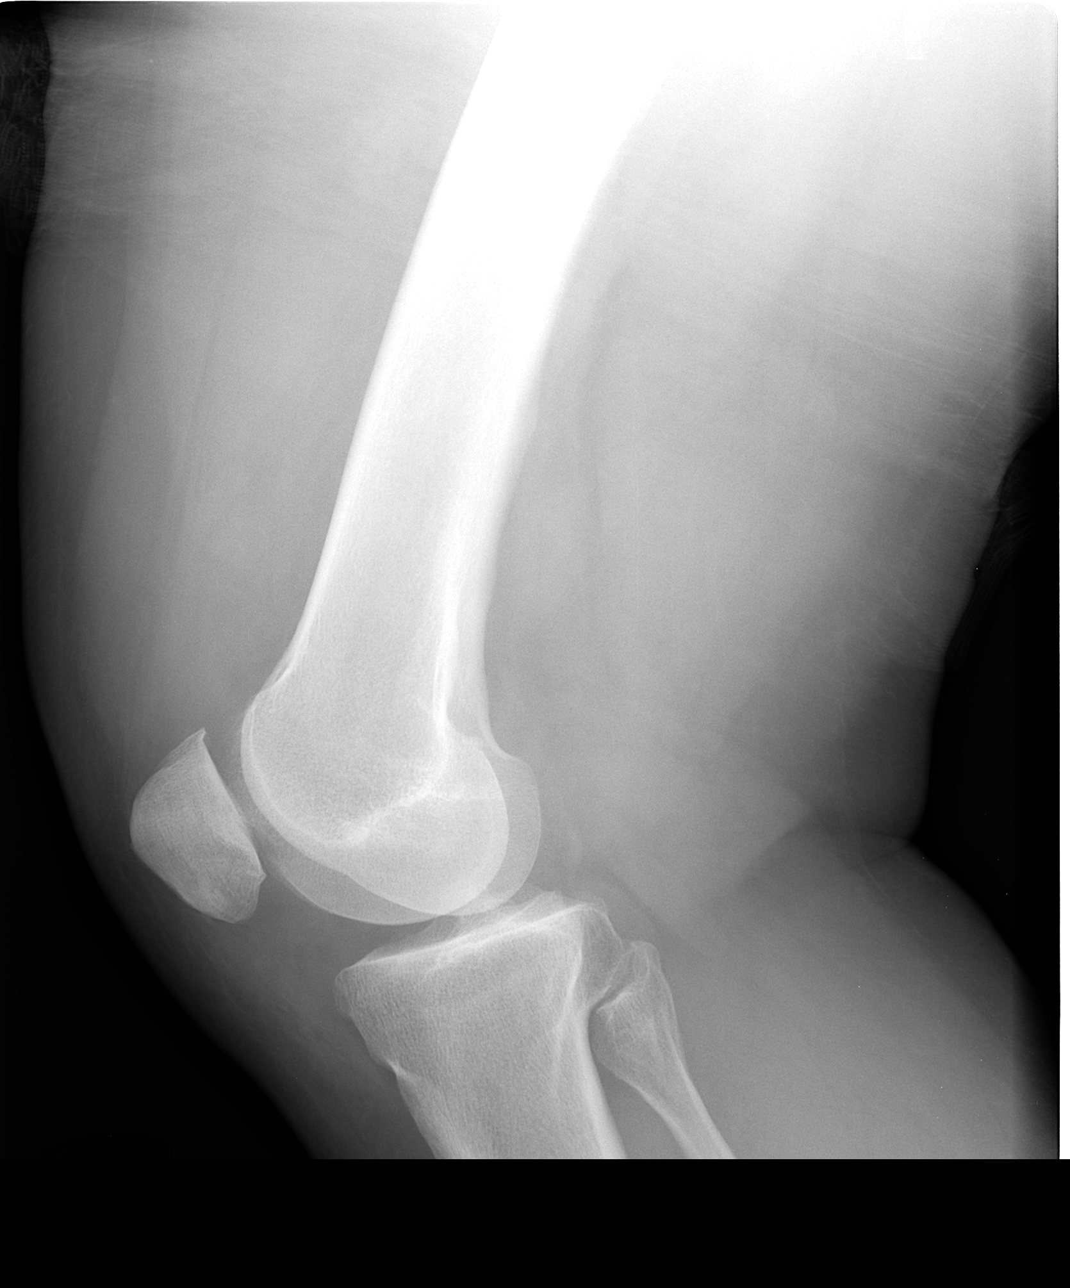

[4 of 4 positions shown; findings below may reference images not displayed]

FINDINGS: Alignment is normal.  There are small patellar, medial
compartment, and lateral compartment osteophytes.  Slight narrowing
of the medial compartment.  No definite joint effusion.  No
fracture or suspicious bony abnormality.
IMPRESSION: 1. Mild tricompartmental osteoarthritis.
 2.  No acute bony abnormality.

## 2012-04-08 ENCOUNTER — Other Ambulatory Visit: Payer: Self-pay

## 2012-04-08 DIAGNOSIS — Z1231 Encounter for screening mammogram for malignant neoplasm of breast: Secondary | ICD-10-CM

## 2012-05-14 ENCOUNTER — Ambulatory Visit: Payer: Self-pay

## 2012-07-28 ENCOUNTER — Other Ambulatory Visit: Payer: Self-pay | Admitting: Orthopedic Surgery

## 2012-08-03 ENCOUNTER — Encounter (HOSPITAL_BASED_OUTPATIENT_CLINIC_OR_DEPARTMENT_OTHER): Payer: Self-pay | Admitting: *Deleted

## 2012-08-03 NOTE — Progress Notes (Signed)
To come in for bmet-ekg-denies any cardiac or resp problems  

## 2012-08-05 ENCOUNTER — Encounter (HOSPITAL_BASED_OUTPATIENT_CLINIC_OR_DEPARTMENT_OTHER)
Admission: RE | Admit: 2012-08-05 | Discharge: 2012-08-05 | Disposition: A | Discharge status: Home / Self Care | Payer: BC Managed Care – PPO | Source: Ambulatory Visit | Attending: Orthopedic Surgery | Admitting: Orthopedic Surgery

## 2012-08-05 LAB — BASIC METABOLIC PANEL
BUN: 8 mg/dL (ref 6–23)
CO2: 28 mEq/L (ref 19–32)
Chloride: 102 mEq/L (ref 96–112)
Creatinine, Ser: 0.71 mg/dL (ref 0.50–1.10)
Glucose, Bld: 99 mg/dL (ref 70–99)

## 2012-08-09 ENCOUNTER — Ambulatory Visit (HOSPITAL_BASED_OUTPATIENT_CLINIC_OR_DEPARTMENT_OTHER): Payer: BC Managed Care – PPO | Admitting: Anesthesiology

## 2012-08-09 ENCOUNTER — Ambulatory Visit (HOSPITAL_BASED_OUTPATIENT_CLINIC_OR_DEPARTMENT_OTHER)
Admission: RE | Admit: 2012-08-09 | Discharge: 2012-08-09 | Disposition: A | Discharge status: Home / Self Care | Payer: BC Managed Care – PPO | Source: Ambulatory Visit | Attending: Orthopedic Surgery | Admitting: Orthopedic Surgery

## 2012-08-09 ENCOUNTER — Encounter (HOSPITAL_BASED_OUTPATIENT_CLINIC_OR_DEPARTMENT_OTHER): Payer: Self-pay

## 2012-08-09 ENCOUNTER — Encounter (HOSPITAL_BASED_OUTPATIENT_CLINIC_OR_DEPARTMENT_OTHER): Payer: Self-pay | Admitting: Anesthesiology

## 2012-08-09 ENCOUNTER — Encounter (HOSPITAL_BASED_OUTPATIENT_CLINIC_OR_DEPARTMENT_OTHER)
Admission: RE | Disposition: A | Discharge status: Home / Self Care | Payer: Self-pay | Source: Ambulatory Visit | Attending: Orthopedic Surgery

## 2012-08-09 DIAGNOSIS — Z79899 Other long term (current) drug therapy: Secondary | ICD-10-CM | POA: Insufficient documentation

## 2012-08-09 DIAGNOSIS — M23329 Other meniscus derangements, posterior horn of medial meniscus, unspecified knee: Secondary | ICD-10-CM | POA: Insufficient documentation

## 2012-08-09 DIAGNOSIS — Z87891 Personal history of nicotine dependence: Secondary | ICD-10-CM | POA: Insufficient documentation

## 2012-08-09 DIAGNOSIS — E119 Type 2 diabetes mellitus without complications: Secondary | ICD-10-CM | POA: Insufficient documentation

## 2012-08-09 DIAGNOSIS — Z6837 Body mass index (BMI) 37.0-37.9, adult: Secondary | ICD-10-CM | POA: Insufficient documentation

## 2012-08-09 DIAGNOSIS — I10 Essential (primary) hypertension: Secondary | ICD-10-CM | POA: Insufficient documentation

## 2012-08-09 DIAGNOSIS — Z880 Allergy status to penicillin: Secondary | ICD-10-CM | POA: Insufficient documentation

## 2012-08-09 DIAGNOSIS — M2390 Unspecified internal derangement of unspecified knee: Secondary | ICD-10-CM | POA: Insufficient documentation

## 2012-08-09 DIAGNOSIS — Z9889 Other specified postprocedural states: Secondary | ICD-10-CM

## 2012-08-09 HISTORY — DX: Essential (primary) hypertension: I10

## 2012-08-09 HISTORY — PX: KNEE ARTHROSCOPY: SHX127

## 2012-08-09 HISTORY — DX: Type 2 diabetes mellitus without complications: E11.9

## 2012-08-09 SURGERY — ARTHROSCOPY, KNEE
Anesthesia: General | Site: Knee | Laterality: Right | Wound class: Clean

## 2012-08-09 MED ORDER — CEFAZOLIN SODIUM-DEXTROSE 2-3 GM-% IV SOLR
2.0000 g | INTRAVENOUS | Status: AC
  Administered 2012-08-09: 2 g via INTRAVENOUS

## 2012-08-09 MED ORDER — LACTATED RINGERS IV SOLN
INTRAVENOUS | Status: DC
Start: 2012-08-09 — End: 2012-08-09
  Administered 2012-08-09 (×2): via INTRAVENOUS

## 2012-08-09 MED ORDER — LIDOCAINE HCL (CARDIAC) 20 MG/ML IV SOLN
INTRAVENOUS | Status: DC | PRN
  Administered 2012-08-09: 100 mg via INTRAVENOUS

## 2012-08-09 MED ORDER — OXYCODONE HCL 5 MG PO TABS
5.0000 mg | ORAL_TABLET | Freq: Once | ORAL | Status: AC | PRN
  Administered 2012-08-09: 5 mg via ORAL

## 2012-08-09 MED ORDER — FENTANYL CITRATE 0.05 MG/ML IJ SOLN: 50.0000 ug | INTRAMUSCULAR | Status: DC | PRN

## 2012-08-09 MED ORDER — ONDANSETRON HCL 4 MG/2ML IJ SOLN
INTRAMUSCULAR | Status: DC | PRN
  Administered 2012-08-09: 4 mg via INTRAVENOUS

## 2012-08-09 MED ORDER — PROPOFOL 10 MG/ML IV BOLUS
INTRAVENOUS | Status: DC | PRN
  Administered 2012-08-09: 200 mg via INTRAVENOUS

## 2012-08-09 MED ORDER — POVIDONE-IODINE 7.5 % EX SOLN: Freq: Once | CUTANEOUS | Status: DC

## 2012-08-09 MED ORDER — FENTANYL CITRATE 0.05 MG/ML IJ SOLN
INTRAMUSCULAR | Status: DC | PRN
  Administered 2012-08-09: 100 ug via INTRAVENOUS

## 2012-08-09 MED ORDER — HYDROMORPHONE HCL PF 1 MG/ML IJ SOLN
0.2500 mg | INTRAMUSCULAR | Status: DC | PRN
  Administered 2012-08-09 (×3): 0.5 mg via INTRAVENOUS

## 2012-08-09 MED ORDER — SODIUM CHLORIDE 0.9 % IR SOLN
Status: DC | PRN
  Administered 2012-08-09: 6000 mL

## 2012-08-09 MED ORDER — BUPIVACAINE HCL (PF) 0.25 % IJ SOLN
INTRAMUSCULAR | Status: DC | PRN
  Administered 2012-08-09: 20 mL via INTRA_ARTICULAR

## 2012-08-09 MED ORDER — MIDAZOLAM HCL 2 MG/2ML IJ SOLN: 1.0000 mg | INTRAMUSCULAR | Status: DC | PRN

## 2012-08-09 MED ORDER — OXYCODONE-ACETAMINOPHEN 5-325 MG PO TABS: 1.0000 | ORAL_TABLET | ORAL | Status: DC | PRN

## 2012-08-09 MED ORDER — OXYCODONE HCL 5 MG/5ML PO SOLN: 5.0000 mg | Freq: Once | ORAL | Status: AC | PRN

## 2012-08-09 MED ORDER — ONDANSETRON HCL 4 MG/2ML IJ SOLN: 4.0000 mg | Freq: Once | INTRAMUSCULAR | Status: DC | PRN

## 2012-08-09 MED ORDER — DEXAMETHASONE SODIUM PHOSPHATE 4 MG/ML IJ SOLN
INTRAMUSCULAR | Status: DC | PRN
  Administered 2012-08-09: 5 mg via INTRAVENOUS

## 2012-08-09 MED ORDER — MIDAZOLAM HCL 5 MG/5ML IJ SOLN
INTRAMUSCULAR | Status: DC | PRN
  Administered 2012-08-09: 2 mg via INTRAVENOUS

## 2012-08-09 SURGICAL SUPPLY — 30 items
BANDAGE ELASTIC 6 VELCRO ST LF (GAUZE/BANDAGES/DRESSINGS) ×2 IMPLANT
BLADE 4.2CUDA (BLADE) ×2 IMPLANT
BLADE CUTTER GATOR 3.5 (BLADE) IMPLANT
BLADE CUTTER MENIS 5.5 (BLADE) IMPLANT
CANISTER OMNI JUG 16 LITER (MISCELLANEOUS) ×1 IMPLANT
CANISTER SUCTION 2500CC (MISCELLANEOUS) IMPLANT
CHLORAPREP W/TINT 26ML (MISCELLANEOUS) ×2 IMPLANT
CLOTH BEACON ORANGE TIMEOUT ST (SAFETY) ×2 IMPLANT
DRAPE ARTHROSCOPY W/POUCH 114 (DRAPES) ×2 IMPLANT
DRSG EMULSION OIL 3X3 NADH (GAUZE/BANDAGES/DRESSINGS) ×2 IMPLANT
GLOVE BIO SURGEON STRL SZ7 (GLOVE) ×2 IMPLANT
GLOVE BIO SURGEON STRL SZ7.5 (GLOVE) ×2 IMPLANT
GLOVE BIOGEL PI IND STRL 7.0 (GLOVE) ×1 IMPLANT
GLOVE BIOGEL PI IND STRL 8 (GLOVE) ×1 IMPLANT
GLOVE BIOGEL PI INDICATOR 7.0 (GLOVE) ×1
GLOVE BIOGEL PI INDICATOR 8 (GLOVE) ×1
GLOVE SURG SS PI 7.0 STRL IVOR (GLOVE) ×1 IMPLANT
GOWN PREVENTION PLUS XLARGE (GOWN DISPOSABLE) ×3 IMPLANT
GOWN STRL REIN 2XL XLG LVL4 (GOWN DISPOSABLE) ×1 IMPLANT
IV NS IRRIG 3000ML ARTHROMATIC (IV SOLUTION) ×4 IMPLANT
KNEE WRAP E Z 3 GEL PACK (MISCELLANEOUS) ×2 IMPLANT
PACK ARTHROSCOPY DSU (CUSTOM PROCEDURE TRAY) ×2 IMPLANT
PACK BASIN DAY SURGERY FS (CUSTOM PROCEDURE TRAY) ×2 IMPLANT
SPONGE GAUZE 4X4 12PLY (GAUZE/BANDAGES/DRESSINGS) ×2 IMPLANT
SUT ETHILON 4 0 PS 2 18 (SUTURE) ×2 IMPLANT
TOWEL OR 17X24 6PK STRL BLUE (TOWEL DISPOSABLE) ×2 IMPLANT
TOWEL OR NON WOVEN STRL DISP B (DISPOSABLE) ×2 IMPLANT
TUBING ARTHROSCOPY IRRIG 16FT (MISCELLANEOUS) ×2 IMPLANT
WAND STAR VAC 90 (SURGICAL WAND) IMPLANT
WATER STERILE IRR 1000ML POUR (IV SOLUTION) ×2 IMPLANT

## 2012-08-09 NOTE — Transfer of Care (Signed)
Immediate Anesthesia Transfer of Care Note  Patient: Tara Stevens  Procedure(s) Performed: Procedure(s): RIGHT ARTHROSCOPY KNEE WITH PARTIAL MENISCECTOMY AND CHONDROPLASTY (Right)  Patient Location: PACU  Anesthesia Type:General  Level of Consciousness: sedated  Airway & Oxygen Therapy: Patient Spontanous Breathing and Patient connected to face mask oxygen  Post-op Assessment: Report given to PACU RN and Post -op Vital signs reviewed and stable  Post vital signs: Reviewed and stable  Complications: No apparent anesthesia complications

## 2012-08-09 NOTE — H&P (Signed)
Tara Stevens is an 42 y.o. female.   Chief Complaint: R knee pain and swelling HPI: R knee pain and swelling failed to respond to conservative treatment.  Continued mechanical symptoms and swelling.  Past Medical History  Diagnosis Date  . Hypertension   . Diabetes mellitus without complication     Past Surgical History  Procedure Laterality Date  . Abdominal hysterectomy  2004    TAH  . Hernia repair  2004    Bilat IHR with TAH  . Cesarean section      History reviewed. No pertinent family history. Social History:  reports that she quit smoking about 10 years ago. She does not have any smokeless tobacco history on file. She reports that  drinks alcohol. She reports that she does not use illicit drugs.  Allergies:  Allergies  Allergen Reactions  . Penicillins Hives    Medications Prior to Admission  Medication Sig Dispense Refill  . metFORMIN (GLUCOPHAGE) 500 MG tablet Take 500 mg by mouth daily with breakfast.      . olmesartan-hydrochlorothiazide (BENICAR HCT) 20-12.5 MG per tablet Take 1 tablet by mouth daily.      . phentermine 37.5 MG capsule Take 37.5 mg by mouth every morning.        Results for orders placed during the hospital encounter of 08/09/12 (from the past 48 hour(s))  GLUCOSE, CAPILLARY     Status: Abnormal   Collection Time    08/09/12 10:34 AM      Result Value Range   Glucose-Capillary 100 (*) 70 - 99 mg/dL   No results found.  Review of Systems  All other systems reviewed and are negative.    Blood pressure 131/87, pulse 76, temperature 98.1 F (36.7 C), temperature source Oral, resp. rate 20, height 5' 4.5" (1.638 m), weight 101.209 kg (223 lb 2 oz), SpO2 99.00%. Physical Exam  Constitutional: She is oriented to person, place, and time. She appears well-developed and well-nourished.  HENT:  Head: Atraumatic.  Eyes: EOM are normal.  Cardiovascular: Intact distal pulses.   Respiratory: Effort normal.  Musculoskeletal:       Right  knee: Tenderness found. Medial joint line tenderness noted.  Neurological: She is alert and oriented to person, place, and time.  Skin: Skin is warm and dry.  Psychiatric: She has a normal mood and affect.     Assessment/Plan R knee MMT chondromalacia Plan right knee arthroscopy Risks / benefits of surgery discussed Consent on chart  NPO for OR Preop antibiotics   Tara Stevens WILLIAM 08/09/2012, 11:27 AM

## 2012-08-09 NOTE — Op Note (Signed)
Procedure(s): RIGHT ARTHROSCOPY KNEE WITH PARTIAL MENISCECTOMY AND CHONDROPLASTY Procedure Note  Tara Stevens female 42 y.o. 08/09/2012  Procedure(s) and Anesthesia Type:    * RIGHT ARTHROSCOPY KNEE WITH PARTIAL MEDIAL MENISCECTOMY AND CHONDROPLASTY OF PATELLA, TROCHLEA and MEDIAL FEMORAL CONDYLE - General  Surgeon(s) and Role:    * Mable Paris, MD - Primary     Surgeon: Mable Paris   Assistants: Caleen Jobs PA-C  Anesthesia: General endotracheal anesthesia    Procedure Detail  RIGHT ARTHROSCOPY KNEE WITH PARTIAL MENISCECTOMY AND CHONDROPLASTY  Estimated Blood Loss: Min         Drains: none  Blood Given: none         Specimens: none        Complications:  * No complications entered in OR log *         Disposition: PACU - hemodynamically stable.         Condition: stable    Procedure:   INDICATIONS FOR SURGERY: The patient is 42 y.o. female who has had several years on and off right knee pain with significant worsening in the last 3 months with more mechanical catching popping and clicking. She had an MRI which revealed posterior horn medial meniscus tear and chondromalacia. She was indicated for surgical treatment to decrease mechanical symptoms and pain. She understood risks benefits alternatives to the procedure and wished to go forward with surgery.  OPERATIVE FINDINGS: Examination under anesthesia: No stiffness or instability Diagnostic Arthroscopy:  articular cartilage: She had some grade 2 changes of the undersurface of the patella and trochlea. There were also grade 2 changes of the medial femoral condyle. No exposed bone. This was all debrided lightly with a shaver. Medial meniscus: She had some degenerative tearing along the posterior horn and body along the free edge had a significant parrot-beak type tear at the posterior meniscal root involving about 80% of the width of the meniscus tear. It extended from the root around about  1 cm. It was displaceable into the joint. This was resected using a combination of biter and shaver. The remaining meniscus was not displaceable were detached. Lateral meniscus: Intact Anterior cruciate ligament/PCL: Intact Loose bodies: None  DESCRIPTION OF PROCEDURE: The patient was identified in preoperative  holding area where I personally marked the operative site after  verifying site, side, and procedure with the patient.  The patient was taken back to the operating room where general anesthesia was induced without complication and was placed in the supine position with the operative leg placed in a padded leg holder. The foot of the bed was dropped. The opposite extremity was well padded.  The right lower extremity was then prepped and  draped in a standard sterile fashion. The appropriate time-out  procedure was carried out. The patient did receive IV antibiotics  within 30 minutes of incision.   A small stab incision was made in the anterolateral portal position. The arthroscope was introduced in the joint. A medial portal was then established under direct visualization just above the anterior horn of the medial meniscus. Diagnostic arthroscopy was then carried out with findings as described above.  She was noted to have grade 2, she the undersurface of the patella and trochlea. This was debrided with a shaver. No exposed bone was noted. In the medial compartment she was noted to have meniscus tearing as described above. The posterior horn root was no significant. There was a large displaceable tear here involving about 80% of the thickness of the meniscus  for about 1 cm. I was able to resect this area without catching the posterior root. At the conclusion of the partial meniscectomy the posterior meniscus was not displaceable. There is a nice smooth edge from the resected portion to the more normal portion. She was noted to have some grade 2 chondromalacia on the medial femoral condyle  the weightbearing portion which was debrided lightly with a shaver. No exposed bone was noted. The knee was placed in a figure 4 position and the lateral compartment was examined. She was not noted to have chondromalacia or meniscal pathology is compartment. The anterior cruciate ligament was examined and found to be intact. No loose bodies were noted in the joint.  The arthroscopic equipment was removed from the joint and the portals were closed with 3-0 nylon in an interrupted fashion. The knee was infiltrated with 20 cc quarter percent Marcaine without epinephrine.  Sterile dressings were then applied including Xeroform 4 x 4's ABDs an ACE bandage.  The patient was then allowed to awaken from general anesthesia, transferred to the stretcher and taken to the recovery room in stable condition.   POSTOPERATIVE PLAN: The patient will be discharged home today and will followup in one week for suture removal and wound check.  We will get her into some physical therapy.

## 2012-08-09 NOTE — Anesthesia Preprocedure Evaluation (Signed)
Anesthesia Evaluation  Patient identified by MRN, date of birth, ID band Patient awake    Reviewed: Allergy & Precautions, H&P , NPO status , Patient's Chart, lab work & pertinent test results  Airway Mallampati: I TM Distance: >3 FB Neck ROM: Full    Dental  (+) Teeth Intact and Dental Advisory Given   Pulmonary  breath sounds clear to auscultation        Cardiovascular hypertension, Pt. on medications Rhythm:Regular     Neuro/Psych    GI/Hepatic   Endo/Other  diabetes, Well Controlled, Type 2, Oral Hypoglycemic AgentsMorbid obesity  Renal/GU      Musculoskeletal   Abdominal   Peds  Hematology   Anesthesia Other Findings   Reproductive/Obstetrics                           Anesthesia Physical Anesthesia Plan  ASA: III  Anesthesia Plan: General   Post-op Pain Management:    Induction: Intravenous  Airway Management Planned: LMA  Additional Equipment:   Intra-op Plan:   Post-operative Plan: Extubation in OR  Informed Consent:   Dental advisory given  Plan Discussed with: CRNA, Anesthesiologist and Surgeon  Anesthesia Plan Comments:         Anesthesia Quick Evaluation

## 2012-08-09 NOTE — Anesthesia Procedure Notes (Signed)
Procedure Name: LMA Insertion Date/Time: 08/09/2012 11:48 AM Performed by: Burna Cash Pre-anesthesia Checklist: Patient identified, Emergency Drugs available, Suction available and Patient being monitored Patient Re-evaluated:Patient Re-evaluated prior to inductionOxygen Delivery Method: Circle System Utilized Preoxygenation: Pre-oxygenation with 100% oxygen Intubation Type: IV induction Ventilation: Mask ventilation without difficulty LMA: LMA inserted LMA Size: 4.0 Number of attempts: 1 Airway Equipment and Method: bite block Placement Confirmation: positive ETCO2 Tube secured with: Tape Dental Injury: Teeth and Oropharynx as per pre-operative assessment

## 2012-08-09 NOTE — Anesthesia Postprocedure Evaluation (Signed)
  Anesthesia Post-op Note  Patient: Tara Stevens  Procedure(s) Performed: Procedure(s): RIGHT ARTHROSCOPY KNEE WITH PARTIAL MENISCECTOMY AND CHONDROPLASTY (Right)  Patient Location: PACU  Anesthesia Type:General  Level of Consciousness: awake, alert  and oriented  Airway and Oxygen Therapy: Patient Spontanous Breathing  Post-op Pain: mild  Post-op Assessment: Post-op Vital signs reviewed  Post-op Vital Signs: Reviewed  Complications: No apparent anesthesia complications

## 2012-08-10 ENCOUNTER — Encounter (HOSPITAL_BASED_OUTPATIENT_CLINIC_OR_DEPARTMENT_OTHER): Payer: Self-pay | Admitting: Orthopedic Surgery

## 2012-09-21 IMAGING — CR DG HAND*L* EXAM
3 series · 3 of 3 positions shown · non-contrast
Comparison: none

[PA]
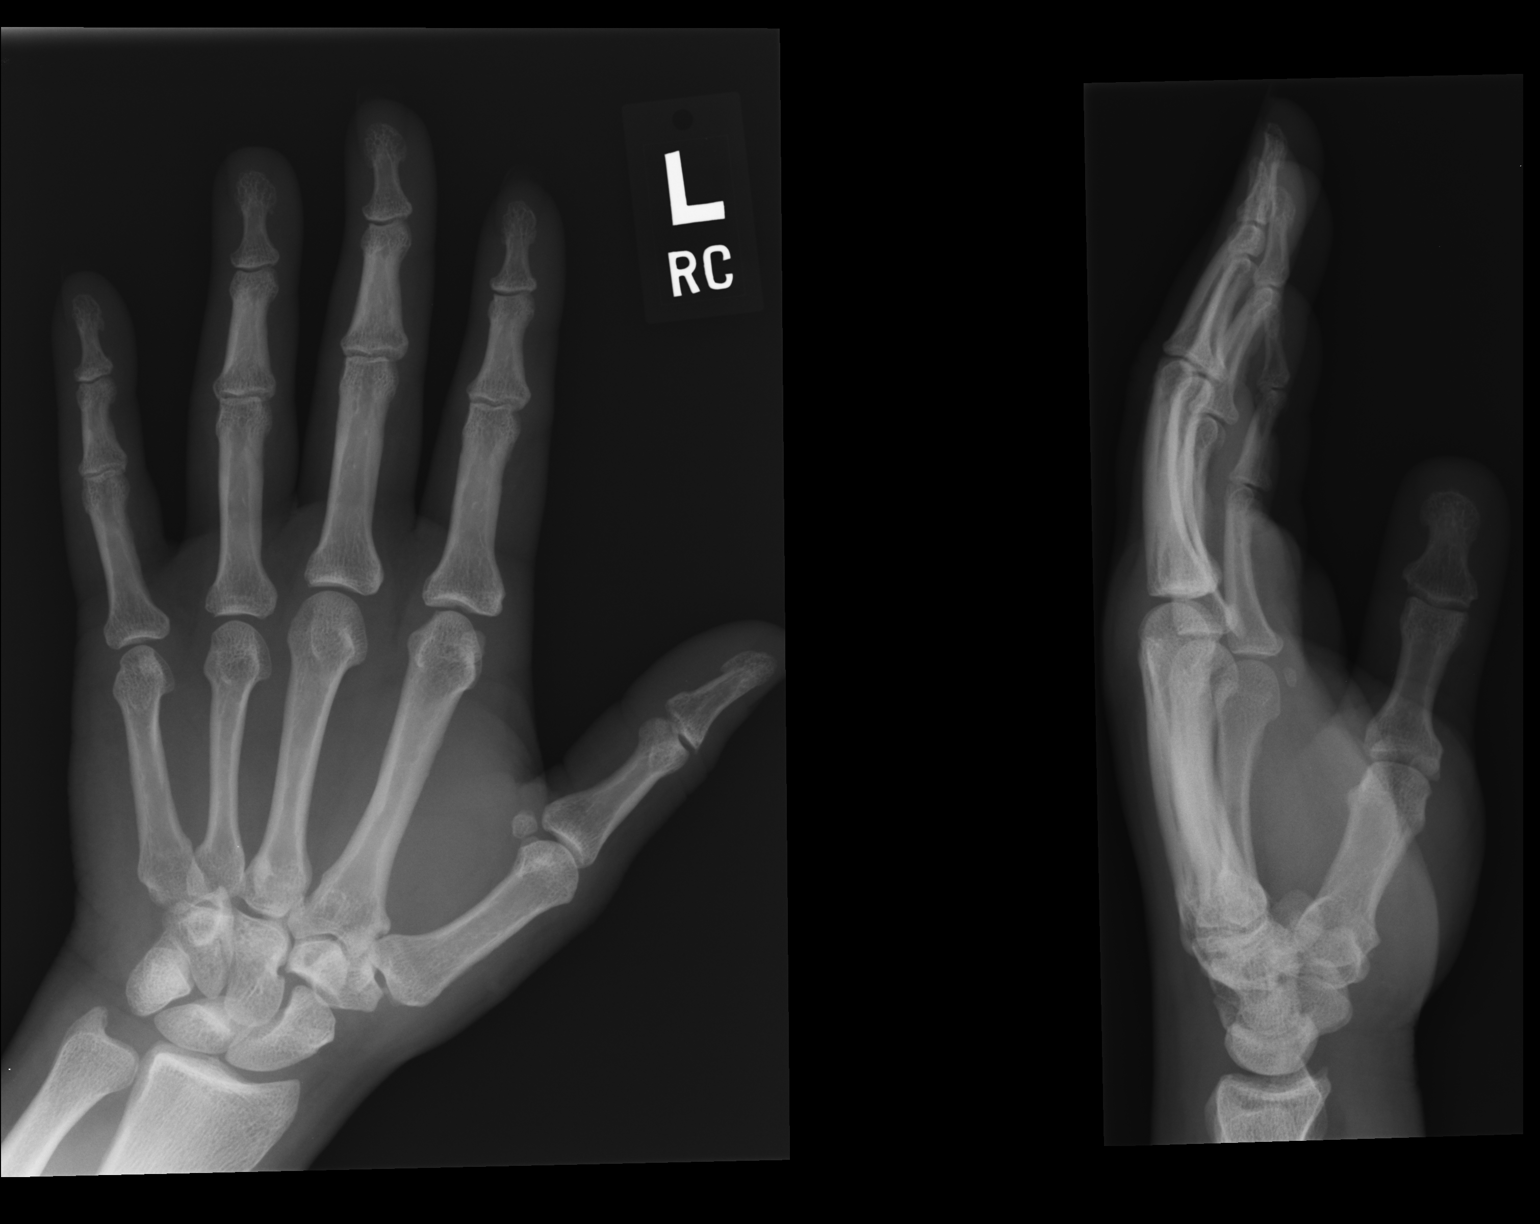

[lateral]
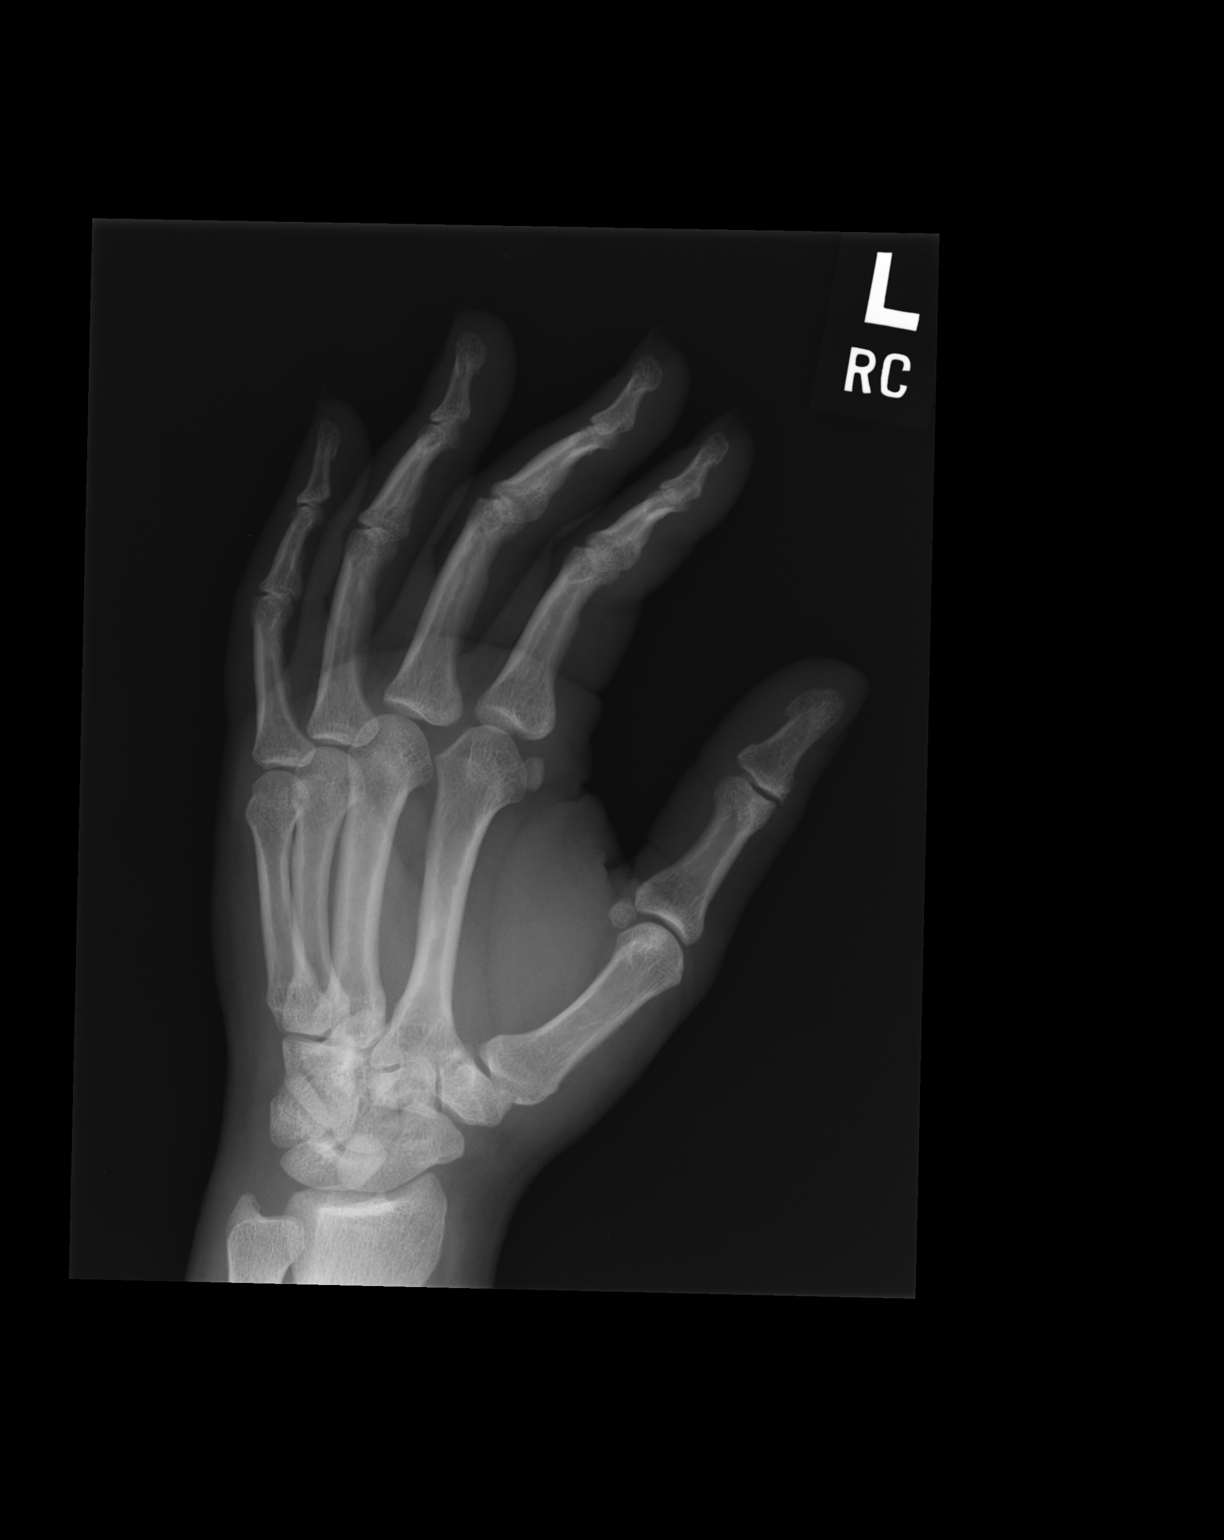

[pa obl]
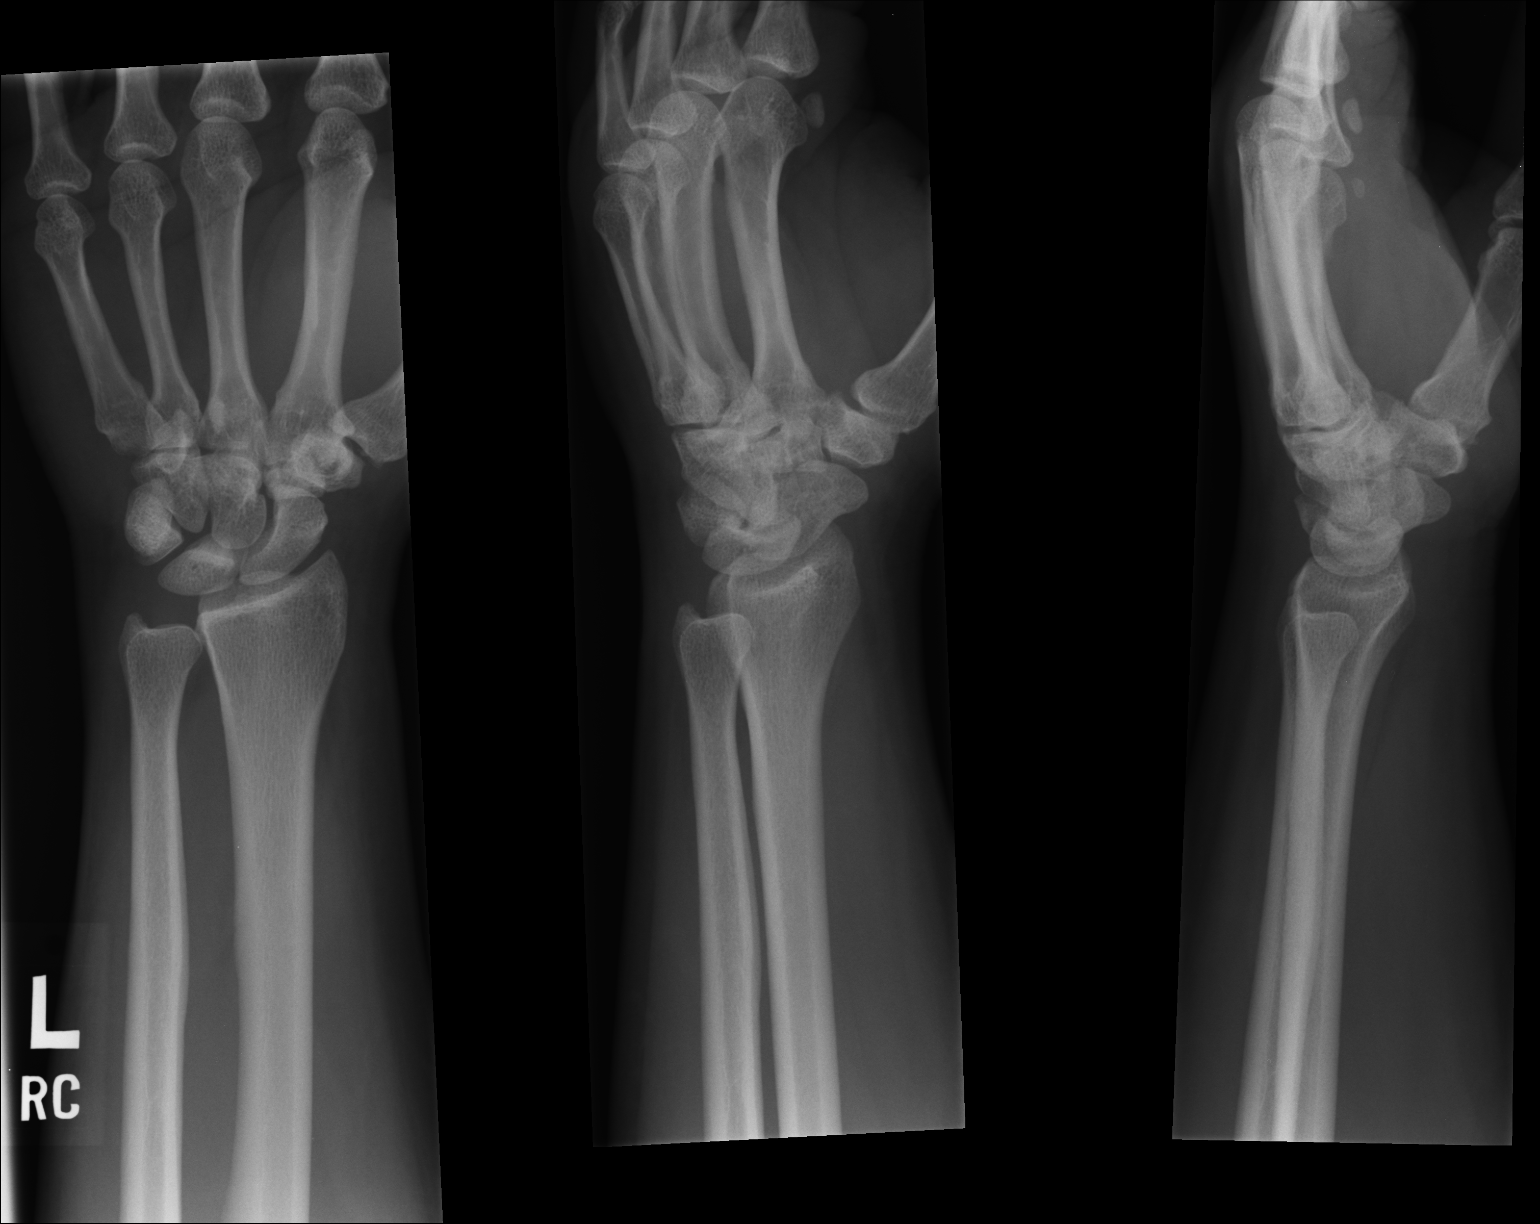

[3 of 3 positions shown; findings below may reference images not displayed]

Canned report from images found in remote index.

Refer to host system for actual result text.

## 2014-02-27 ENCOUNTER — Other Ambulatory Visit: Payer: Self-pay | Admitting: Orthopedic Surgery

## 2014-02-28 ENCOUNTER — Encounter (HOSPITAL_BASED_OUTPATIENT_CLINIC_OR_DEPARTMENT_OTHER): Payer: Self-pay | Admitting: *Deleted

## 2014-03-01 ENCOUNTER — Encounter (HOSPITAL_BASED_OUTPATIENT_CLINIC_OR_DEPARTMENT_OTHER)
Admission: RE | Admit: 2014-03-01 | Discharge: 2014-03-01 | Disposition: A | Discharge status: Home / Self Care | Payer: BLUE CROSS/BLUE SHIELD | Source: Ambulatory Visit | Attending: Orthopedic Surgery | Admitting: Orthopedic Surgery

## 2014-03-01 ENCOUNTER — Other Ambulatory Visit: Payer: Self-pay

## 2014-03-01 DIAGNOSIS — Z01818 Encounter for other preprocedural examination: Secondary | ICD-10-CM | POA: Insufficient documentation

## 2014-03-01 DIAGNOSIS — M23207 Derangement of unspecified meniscus due to old tear or injury, left knee: Secondary | ICD-10-CM | POA: Insufficient documentation

## 2014-03-01 DIAGNOSIS — M94262 Chondromalacia, left knee: Secondary | ICD-10-CM | POA: Diagnosis not present

## 2014-03-01 LAB — BASIC METABOLIC PANEL
Anion gap: 8 (ref 5–15)
BUN: 9 mg/dL (ref 6–23)
CALCIUM: 9 mg/dL (ref 8.4–10.5)
CO2: 29 mmol/L (ref 19–32)
CREATININE: 0.71 mg/dL (ref 0.50–1.10)
Chloride: 103 mEq/L (ref 96–112)
GFR calc Af Amer: >90 mL/min (ref >90)
Glucose, Bld: 95 mg/dL (ref 70–99)
Potassium: 3.9 mmol/L (ref 3.5–5.1)
Sodium: 140 mmol/L (ref 135–145)

## 2014-03-01 NOTE — Progress Notes (Signed)
Dr. Noreene LarssonJoslin reviewed Tara FanningEkg [facility] [place] for surgery

## 2014-03-06 ENCOUNTER — Ambulatory Visit (HOSPITAL_BASED_OUTPATIENT_CLINIC_OR_DEPARTMENT_OTHER): Payer: BLUE CROSS/BLUE SHIELD | Admitting: Anesthesiology

## 2014-03-06 ENCOUNTER — Encounter (HOSPITAL_BASED_OUTPATIENT_CLINIC_OR_DEPARTMENT_OTHER): Payer: Self-pay | Admitting: *Deleted

## 2014-03-06 ENCOUNTER — Encounter (HOSPITAL_BASED_OUTPATIENT_CLINIC_OR_DEPARTMENT_OTHER)
Admission: RE | Disposition: A | Discharge status: Home / Self Care | Payer: Self-pay | Source: Ambulatory Visit | Attending: Orthopedic Surgery

## 2014-03-06 ENCOUNTER — Ambulatory Visit (HOSPITAL_BASED_OUTPATIENT_CLINIC_OR_DEPARTMENT_OTHER)
Admission: RE | Admit: 2014-03-06 | Discharge: 2014-03-06 | Disposition: A | Discharge status: Home / Self Care | Payer: BLUE CROSS/BLUE SHIELD | Source: Ambulatory Visit | Attending: Orthopedic Surgery | Admitting: Orthopedic Surgery

## 2014-03-06 DIAGNOSIS — Y929 Unspecified place or not applicable: Secondary | ICD-10-CM | POA: Diagnosis not present

## 2014-03-06 DIAGNOSIS — S83282A Other tear of lateral meniscus, current injury, left knee, initial encounter: Secondary | ICD-10-CM | POA: Diagnosis not present

## 2014-03-06 DIAGNOSIS — Z880 Allergy status to penicillin: Secondary | ICD-10-CM | POA: Insufficient documentation

## 2014-03-06 DIAGNOSIS — I10 Essential (primary) hypertension: Secondary | ICD-10-CM | POA: Insufficient documentation

## 2014-03-06 DIAGNOSIS — X58XXXA Exposure to other specified factors, initial encounter: Secondary | ICD-10-CM | POA: Insufficient documentation

## 2014-03-06 DIAGNOSIS — Y939 Activity, unspecified: Secondary | ICD-10-CM | POA: Diagnosis not present

## 2014-03-06 DIAGNOSIS — S83242A Other tear of medial meniscus, current injury, left knee, initial encounter: Secondary | ICD-10-CM | POA: Diagnosis not present

## 2014-03-06 DIAGNOSIS — Z87891 Personal history of nicotine dependence: Secondary | ICD-10-CM | POA: Insufficient documentation

## 2014-03-06 DIAGNOSIS — E119 Type 2 diabetes mellitus without complications: Secondary | ICD-10-CM | POA: Diagnosis not present

## 2014-03-06 DIAGNOSIS — M94262 Chondromalacia, left knee: Secondary | ICD-10-CM | POA: Insufficient documentation

## 2014-03-06 HISTORY — PX: KNEE ARTHROSCOPY WITH MEDIAL MENISECTOMY: SHX5651

## 2014-03-06 HISTORY — DX: Unspecified tear of unspecified meniscus, current injury, unspecified knee, initial encounter: S83.209A

## 2014-03-06 LAB — POCT HEMOGLOBIN-HEMACUE: Hemoglobin: 14.6 g/dL (ref 12.0–15.0)

## 2014-03-06 SURGERY — ARTHROSCOPY, KNEE, WITH MEDIAL MENISCECTOMY
Anesthesia: General | Site: Knee | Laterality: Left

## 2014-03-06 MED ORDER — MEPERIDINE HCL 25 MG/ML IJ SOLN: 6.2500 mg | INTRAMUSCULAR | Status: DC | PRN

## 2014-03-06 MED ORDER — POVIDONE-IODINE 7.5 % EX SOLN: Freq: Once | CUTANEOUS | Status: DC

## 2014-03-06 MED ORDER — MIDAZOLAM HCL 2 MG/2ML IJ SOLN
INTRAMUSCULAR | Status: AC
  Filled 2014-03-06: qty 2

## 2014-03-06 MED ORDER — MIDAZOLAM HCL 2 MG/2ML IJ SOLN: 1.0000 mg | INTRAMUSCULAR | Status: DC | PRN

## 2014-03-06 MED ORDER — LACTATED RINGERS IV SOLN
INTRAVENOUS | Status: DC
  Administered 2014-03-06 (×2): via INTRAVENOUS

## 2014-03-06 MED ORDER — CLINDAMYCIN PHOSPHATE 900 MG/50ML IV SOLN
INTRAVENOUS | Status: AC
  Filled 2014-03-06: qty 50

## 2014-03-06 MED ORDER — FENTANYL CITRATE 0.05 MG/ML IJ SOLN: 50.0000 ug | INTRAMUSCULAR | Status: DC | PRN

## 2014-03-06 MED ORDER — HYDROMORPHONE HCL 1 MG/ML IJ SOLN
INTRAMUSCULAR | Status: AC
  Filled 2014-03-06: qty 1

## 2014-03-06 MED ORDER — PROPOFOL 10 MG/ML IV BOLUS
INTRAVENOUS | Status: DC | PRN
  Administered 2014-03-06: 150 mg via INTRAVENOUS

## 2014-03-06 MED ORDER — LIDOCAINE HCL (CARDIAC) 20 MG/ML IV SOLN
INTRAVENOUS | Status: DC | PRN
  Administered 2014-03-06: 50 mg via INTRAVENOUS

## 2014-03-06 MED ORDER — ONDANSETRON HCL 4 MG/2ML IJ SOLN: 4.0000 mg | Freq: Once | INTRAMUSCULAR | Status: DC | PRN

## 2014-03-06 MED ORDER — DEXAMETHASONE SODIUM PHOSPHATE 4 MG/ML IJ SOLN
INTRAMUSCULAR | Status: DC | PRN
  Administered 2014-03-06: 10 mg via INTRAVENOUS

## 2014-03-06 MED ORDER — MIDAZOLAM HCL 5 MG/5ML IJ SOLN
INTRAMUSCULAR | Status: DC | PRN
  Administered 2014-03-06: 2 mg via INTRAVENOUS

## 2014-03-06 MED ORDER — FENTANYL CITRATE 0.05 MG/ML IJ SOLN
INTRAMUSCULAR | Status: AC
  Filled 2014-03-06: qty 6

## 2014-03-06 MED ORDER — PROPOFOL 10 MG/ML IV EMUL
INTRAVENOUS | Status: AC
Start: 2014-03-06 — End: 2014-03-06
  Filled 2014-03-06: qty 50

## 2014-03-06 MED ORDER — FENTANYL CITRATE 0.05 MG/ML IJ SOLN
INTRAMUSCULAR | Status: DC | PRN
  Administered 2014-03-06: 25 ug via INTRAVENOUS
  Administered 2014-03-06: 100 ug via INTRAVENOUS
  Administered 2014-03-06: 25 ug via INTRAVENOUS
  Administered 2014-03-06: 50 ug via INTRAVENOUS

## 2014-03-06 MED ORDER — HYDROCODONE-ACETAMINOPHEN 5-325 MG PO TABS
ORAL_TABLET | ORAL | Status: AC
  Filled 2014-03-06: qty 2

## 2014-03-06 MED ORDER — BUPIVACAINE HCL (PF) 0.25 % IJ SOLN
INTRAMUSCULAR | Status: DC | PRN
  Administered 2014-03-06: 20 mL

## 2014-03-06 MED ORDER — HYDROCODONE-ACETAMINOPHEN 5-325 MG PO TABS: 2.0000 | ORAL_TABLET | Freq: Once | ORAL | Status: DC

## 2014-03-06 MED ORDER — HYDROMORPHONE HCL 1 MG/ML IJ SOLN
0.2500 mg | INTRAMUSCULAR | Status: DC | PRN
  Administered 2014-03-06 (×4): 0.5 mg via INTRAVENOUS

## 2014-03-06 MED ORDER — PHENYLEPHRINE HCL 10 MG/ML IJ SOLN: INTRAMUSCULAR | Status: DC | PRN

## 2014-03-06 MED ORDER — HYDROCODONE-ACETAMINOPHEN 5-325 MG PO TABS: 1.0000 | ORAL_TABLET | Freq: Four times a day (QID) | ORAL | Status: AC | PRN

## 2014-03-06 MED ORDER — PHENYLEPHRINE HCL 10 MG/ML IJ SOLN
INTRAMUSCULAR | Status: DC | PRN
  Administered 2014-03-06: 40 ug via INTRAVENOUS

## 2014-03-06 MED ORDER — CLINDAMYCIN PHOSPHATE 900 MG/50ML IV SOLN
900.0000 mg | INTRAVENOUS | Status: AC
  Administered 2014-03-06: 900 mg via INTRAVENOUS

## 2014-03-06 MED ORDER — SODIUM CHLORIDE 0.9 % IR SOLN
Status: DC | PRN
  Administered 2014-03-06: 6000 mL

## 2014-03-06 MED ORDER — BUPIVACAINE HCL (PF) 0.25 % IJ SOLN
INTRAMUSCULAR | Status: AC
Start: 2014-03-06 — End: 2014-03-06
  Filled 2014-03-06: qty 30

## 2014-03-06 SURGICAL SUPPLY — 35 items
BANDAGE ELASTIC 6 VELCRO ST LF (GAUZE/BANDAGES/DRESSINGS) ×3 IMPLANT
BLADE 4.2CUDA (BLADE) ×3 IMPLANT
BLADE CUTTER GATOR 3.5 (BLADE) IMPLANT
BLADE CUTTER MENIS 5.5 (BLADE) IMPLANT
CANISTER SUCT 3000ML (MISCELLANEOUS) IMPLANT
CHLORAPREP W/TINT 26ML (MISCELLANEOUS) ×3 IMPLANT
DRAPE ARTHROSCOPY W/POUCH 114 (DRAPES) ×3 IMPLANT
DRAPE U-SHAPE 47X51 STRL (DRAPES) IMPLANT
DRSG EMULSION OIL 3X3 NADH (GAUZE/BANDAGES/DRESSINGS) ×3 IMPLANT
GAUZE SPONGE 4X4 12PLY STRL (GAUZE/BANDAGES/DRESSINGS) ×3 IMPLANT
GLOVE BIO SURGEON STRL SZ7 (GLOVE) ×5 IMPLANT
GLOVE BIO SURGEON STRL SZ7.5 (GLOVE) ×3 IMPLANT
GLOVE BIOGEL PI IND STRL 7.0 (GLOVE) ×1 IMPLANT
GLOVE BIOGEL PI IND STRL 8 (GLOVE) ×1 IMPLANT
GLOVE BIOGEL PI INDICATOR 7.0 (GLOVE) ×2
GLOVE BIOGEL PI INDICATOR 8 (GLOVE) ×2
GLOVE SURG SS PI 7.0 STRL IVOR (GLOVE) ×2 IMPLANT
GOWN STRL REUS W/ TWL LRG LVL3 (GOWN DISPOSABLE) ×2 IMPLANT
GOWN STRL REUS W/ TWL XL LVL3 (GOWN DISPOSABLE) IMPLANT
GOWN STRL REUS W/TWL LRG LVL3 (GOWN DISPOSABLE) ×6
GOWN STRL REUS W/TWL XL LVL3 (GOWN DISPOSABLE) ×3
GOWN STRL REUS W/TWL XL LVL4 (GOWN DISPOSABLE) ×3 IMPLANT
IV NS IRRIG 3000ML ARTHROMATIC (IV SOLUTION) ×6 IMPLANT
KNEE WRAP E Z 3 GEL PACK (MISCELLANEOUS) ×3 IMPLANT
MANIFOLD NEPTUNE II (INSTRUMENTS) ×3 IMPLANT
PACK ARTHROSCOPY DSU (CUSTOM PROCEDURE TRAY) ×3 IMPLANT
PACK BASIN DAY SURGERY FS (CUSTOM PROCEDURE TRAY) ×3 IMPLANT
SUT ETHILON 4 0 PS 2 18 (SUTURE) ×3 IMPLANT
SUT TIGER TAPE 7 IN WHITE (SUTURE) IMPLANT
TAPE FIBER 2MM 7IN #2 BLUE (SUTURE) IMPLANT
TOWEL OR 17X24 6PK STRL BLUE (TOWEL DISPOSABLE) ×3 IMPLANT
TOWEL OR NON WOVEN STRL DISP B (DISPOSABLE) ×3 IMPLANT
TUBING ARTHROSCOPY IRRIG 16FT (MISCELLANEOUS) ×3 IMPLANT
WAND STAR VAC 90 (SURGICAL WAND) IMPLANT
WATER STERILE IRR 1000ML POUR (IV SOLUTION) ×3 IMPLANT

## 2014-03-06 NOTE — Anesthesia Procedure Notes (Signed)
Procedure Name: LMA Insertion Date/Time: 03/06/2014 2:09 PM Performed by: Zenia ResidesPAYNE, Rielly Brunn D Pre-anesthesia Checklist: Patient identified, Emergency Drugs available, Suction available and Patient being monitored Patient Re-evaluated:Patient Re-evaluated prior to inductionOxygen Delivery Method: Circle System Utilized Preoxygenation: Pre-oxygenation with 100% oxygen Intubation Type: IV induction Ventilation: Mask ventilation without difficulty LMA: LMA inserted LMA Size: 4.0 Number of attempts: 1 Airway Equipment and Method: Bite block Placement Confirmation: positive ETCO2 Tube secured with: Tape Dental Injury: Teeth and Oropharynx as per pre-operative assessment

## 2014-03-06 NOTE — Discharge Instructions (Signed)
°  Post Anesthesia Home Care Instructions  Activity: Get plenty of rest for the remainder of the day. A responsible adult should stay with you for 24 hours following the procedure.  For the next 24 hours, DO NOT: -Drive a car -Advertising copywriterperate machinery -Drink alcoholic beverages -Take any medication unless instructed by your physician -Make any legal decisions or sign important papers.  Meals: Start with liquid foods such as gelatin or soup. Progress to regular foods as tolerated. Avoid greasy, spicy, heavy foods. If nausea and/or vomiting occur, drink only clear liquids until the nausea and/or vomiting subsides. Call your physician if vomiting continues.  Special Instructions/Symptoms: Your throat may feel dry or sore from the anesthesia or the breathing tube placed in your throat during surgery. If this causes discomfort, gargle with warm salt water. The discomfort should disappear within 24 hours.   Discharge Instructions after Knee Arthroscopy   You will have a light dressing on your knee.  Leave the dressing in place until the third day after your surgery and then remove it and place a band-aid over the stitches.  After the bandage has been removed you may shower, but do not soak the incision. You may begin gentle motion of your leg immediately after surgery. Pump your foot up and down 20 times per hour, every hour you are awake.  Apply ice to the knee 3 times per day for 30 minutes for the first 1 week until your knee is feeling comfortable again. Do not use heat.  You may begin straight leg raising exercises (if you have a brace with it on). While lying down, pull your foot all the way up, tighten your quadriceps muscle and lift your heel off of the ground. Hold this position for 2 seconds, and then let the leg back down. Repeat the exercise 10 times, at least 3 times a day.  Pain medicine has been prescribed for you.  Use your medicine as needed over the first 48 hours, and then you can  begin to taper your use. You may take Extra Strength Tylenol or Tylenol only in place of the pain pills.  Take one 81mg  aspirin daily for 3 weeks post-operatively unless it upsets your stomach.   Please call [phone] during normal business hours or [phone] after hours for any problems. Including the following:  - excessive redness of the incisions - drainage for more than 4 days - fever of more than 101.5 F  *Please note that pain medications will not be refilled after hours or on weekends.    Post Anesthesia Home Care Instructions  Activity: Get plenty of rest for the remainder of the day. A responsible adult should stay with you for 24 hours following the procedure.  For the next 24 hours, DO NOT: -Drive a car -Advertising copywriterperate machinery -Drink alcoholic beverages -Take any medication unless instructed by your physician -Make any legal decisions or sign important papers.  Meals: Start with liquid foods such as gelatin or soup. Progress to regular foods as tolerated. Avoid greasy, spicy, heavy foods. If nausea and/or vomiting occur, drink only clear liquids until the nausea and/or vomiting subsides. Call your physician if vomiting continues.  Special Instructions/Symptoms: Your throat may feel dry or sore from the anesthesia or the breathing tube placed in your throat during surgery. If this causes discomfort, gargle with warm salt water. The discomfort should disappear within 24 hours.

## 2014-03-06 NOTE — H&P (Signed)
Tara Stevens is an 44 y.o. female.   Chief Complaint: L knee pain HPI: L knee pain, mechanical sx with medial and lateral meniscus tears, failed conservative treatment  Past Medical History  Diagnosis Date  . Hypertension   . Diabetes mellitus without complication     no meds since wt loss  . Meniscus tear     left    Past Surgical History  Procedure Laterality Date  . Abdominal hysterectomy  2004    TAH  . Hernia repair  2004    Bilat IHR with TAH  . Cesarean section    . Knee arthroscopy Right 08/09/2012    Procedure: RIGHT ARTHROSCOPY KNEE WITH PARTIAL MENISCECTOMY AND CHONDROPLASTY;  Surgeon: Mable ParisJustin William Samatha Anspach, MD;  Location: [facility];  Service: Orthopedics;  Laterality: Right;    History reviewed. No pertinent family history. Social History:  reports that she quit smoking about 11 years ago. She does not have any smokeless tobacco history on file. She reports that she drinks alcohol. She reports that she does not use illicit drugs.  Allergies:  Allergies  Allergen Reactions  . Penicillins Hives    Medications Prior to Admission  Medication Sig Dispense Refill  . olmesartan-hydrochlorothiazide (BENICAR HCT) 20-12.5 MG per tablet Take 1 tablet by mouth daily.    . phentermine 37.5 MG capsule Take 37.5 mg by mouth every morning.      Results for orders placed or performed during the hospital encounter of 03/06/14 (from the past 48 hour(s))  Hemoglobin-hemacue, POC     Status: None   Collection Time: 03/06/14 11:40 AM  Result Value Ref Range   Hemoglobin 14.6 12.0 - 15.0 g/dL   No results found.  Review of Systems  All other systems reviewed and are negative.   Blood pressure 134/89, pulse 76, temperature 98.1 F (36.7 C), temperature source Oral, resp. rate 16, height 5\' 5"  (1.651 m), weight 99.791 kg (220 lb), SpO2 100 %. Physical Exam  Constitutional: She is oriented to person, place, and time. She appears well-developed and  well-nourished.  HENT:  Head: Atraumatic.  Eyes: EOM are normal.  Cardiovascular: Intact distal pulses.   Respiratory: Effort normal.  Musculoskeletal:  L knee mild swelling. Medial and lat joint line TTP  Neurological: She is alert and oriented to person, place, and time.  Skin: Skin is warm and dry.  Psychiatric: She has a normal mood and affect.     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

## 2014-03-06 NOTE — Op Note (Signed)
Procedure(s): Procedure Note  Yvetta Codericole N Nerio female 44 y.o. 03/06/2014  Procedure(s) and Anesthesia Type:    *Left KNEE ARTHROSCOPY WITH PARTIAL MEDIAL AND LATERAL MENISECTOMY - General #2 left knee arthroscopic abrasion arthroplasty patella and trochlea  Surgeon(s) and Role:    * Mable ParisJustin William Chalsey Leeth, MD - Primary     Surgeon: Mable ParisHANDLER,Jaque Dacy WILLIAM   Assistants: Damita Lackanielle Lalibert PA-C [facility](Danielle was present and scrubbed throughout the procedure and was essential in positioning, assisting with the camera and instrumentation,, and closure)  Anesthesia: General endotracheal anesthesia     Procedure Detail    Estimated Blood Loss: Min         Drains: none  Blood Given: none         Specimens: none        Complications:  * No complications entered in OR log *         Disposition: PACU - hemodynamically stable.         Condition: stable    Procedure:   INDICATIONS FOR SURGERY: The patient is 44 y.o. female who has a history of recurrent swelling and mechanical symptoms in the left knee which has been refractory to nonoperative management and injections. She has an MRI which revealed medial and lateral meniscus tears as well as some chondromalacia in each compartment. She was indicated for surgical treatment to try and decrease pain and restore function. She understood risks benefits alternatives procedure and wished to go forward with surgery.  OPERATIVE FINDINGS: Examination under anesthesia: No stiffness or instability   DESCRIPTION OF PROCEDURE: The patient was identified in preoperative  holding area where I personally marked the operative site after  verifying site, side, and procedure with the patient.  The patient was taken back to the operating room where general anesthesia was induced without complication and was placed in the supine position with the operative leg placed in a padded leg holder. The foot of the bed was dropped. The opposite  extremity was well padded.  The left lower extremity was then prepped and  draped in a standard sterile fashion. The appropriate time-out  procedure was carried out. The patient did receive IV antibiotics  within 30 minutes of incision.   A small stab incision was made in the anterolateral portal position. The arthroscope was introduced in the joint. A medial portal was then established under direct visualization just above the anterior horn of the medial meniscus. Diagnostic arthroscopy was then carried out with findings as described above.  She was noted to have a small tear of the posterior horn medial meniscus which extended only through about 10-20% of the inner surface. A large biter was used to resect this and a smooth even fashion and then the shaver was used to smooth the edge and remove the resected pieces. The remaining meniscus was probed carefully and there were no further tears or displaceable fragments.  Attention was then turned to the notch where the anterior cruciate ligament was examined and found to be completely intact. The lateral compartment was then examined in a figure 4 position. Posteriorly there was some mild undersurface fraying of the labrum which was debrided with a shaver and biter and then the anterior horn of the lateral meniscus was carefully examined and found to have significant tearing. The inner one third was partially attached and severely degenerated. A large biter was used to resect this in the shaver was used to debride back to healthy appearing non displaceable meniscus.  Attention was then turned  to the patellofemoral joint which is noted to be tracking appropriately. The undersurface of the patella had some grade 3 changes diffusely and a small area of grade 4. The shaver was used to debride the grade 3 areas down to a smooth even surface and the grade 4 area to bleeding bone to promote healing. The trochlea was also noted to have some grade 3 changes  diffusely with a small stripe of grade 4 chondromalacia centrally which again was debrided with shaver to smooth the edges and down to bleeding bone centrally to promote fibrocartilage formation.  The arthroscopic equipment was removed from the joint and the portals were closed with 3-0 nylon in an interrupted fashion. The knee was infiltrated with 20 mL quarter percent Marcaine.  Sterile dressings were then applied including Xeroform 4 x 4's ABDs an ACE bandage.  The patient was then allowed to awaken from general anesthesia, transferred to the stretcher and taken to the recovery room in stable condition.   POSTOPERATIVE PLAN: The patient will be discharged home today and will followup in one week for suture removal and wound check.  She can get into physical therapy right away.

## 2014-03-06 NOTE — Transfer of Care (Signed)
Immediate Anesthesia Transfer of Care Note  Patient: Tara Stevens  Procedure(s) Performed: Procedure(s) with comments: KNEE ARTHROSCOPY WITH MEDIAL MENISECTOMY (Left) - Left knee arthroscopy menisectomy, debridement, chondromalacia  Patient Location: PACU  Anesthesia Type:General  Level of Consciousness: awake, sedated and patient cooperative  Airway & Oxygen Therapy: Patient Spontanous Breathing and Patient connected to face mask oxygen  Post-op Assessment: Report given to PACU RN and Post -op Vital signs reviewed and stable  Post vital signs: Reviewed and stable  Complications: No apparent anesthesia complications

## 2014-03-06 NOTE — Anesthesia Postprocedure Evaluation (Signed)
Anesthesia Post Note  Patient: Tara Stevens  Procedure(s) Performed: Procedure(s) (LRB): KNEE ARTHROSCOPY WITH MEDIAL MENISECTOMY (Left)  Anesthesia type: general  Patient location: PACU  Post pain: Pain level controlled  Post assessment: Patient's Cardiovascular Status Stable  Last Vitals:  Filed Vitals:   03/06/14 1600  BP: 123/79  Pulse: 85  Temp:   Resp: 12    Post vital signs: Reviewed and stable  Level of consciousness: sedated  Complications: No apparent anesthesia complications

## 2014-03-06 NOTE — Anesthesia Preprocedure Evaluation (Signed)
Anesthesia Evaluation  Patient identified by MRN, date of birth, ID band Patient awake    Reviewed: Allergy & Precautions, NPO status , Patient's Chart, lab work & pertinent test results  Airway Mallampati: I  TM Distance: >3 FB Neck ROM: Full    Dental   Pulmonary former smoker,          Cardiovascular hypertension, Pt. on medications     Neuro/Psych    GI/Hepatic   Endo/Other  diabetes, Type 2, Oral Hypoglycemic Agents  Renal/GU      Musculoskeletal   Abdominal   Peds  Hematology   Anesthesia Other Findings   Reproductive/Obstetrics                             Anesthesia Physical Anesthesia Plan  ASA: II  Anesthesia Plan: General   Post-op Pain Management:    Induction: Intravenous  Airway Management Planned: LMA  Additional Equipment:   Intra-op Plan:   Post-operative Plan: Extubation in OR  Informed Consent: I have reviewed the patients History and Physical, chart, labs and discussed the procedure including the risks, benefits and alternatives for the proposed anesthesia with the patient or authorized representative who has indicated his/her understanding and acceptance.     Plan Discussed with: CRNA and Surgeon  Anesthesia Plan Comments:         Anesthesia Quick Evaluation  

## 2014-03-07 ENCOUNTER — Encounter (HOSPITAL_BASED_OUTPATIENT_CLINIC_OR_DEPARTMENT_OTHER): Payer: Self-pay | Admitting: Orthopedic Surgery

## 2014-06-02 ENCOUNTER — Ambulatory Visit (HOSPITAL_COMMUNITY)
Admission: RE | Admit: 2014-06-02 | Discharge: 2014-06-02 | Disposition: A | Discharge status: Home / Self Care | Payer: BLUE CROSS/BLUE SHIELD | Source: Ambulatory Visit | Attending: Orthopedic Surgery | Admitting: Orthopedic Surgery

## 2014-06-02 ENCOUNTER — Other Ambulatory Visit (HOSPITAL_COMMUNITY): Payer: Self-pay | Admitting: Orthopedic Surgery

## 2014-06-02 DIAGNOSIS — M25562 Pain in left knee: Secondary | ICD-10-CM

## 2014-06-02 NOTE — Progress Notes (Signed)
Left lower extremity venous duplex completed.  Left:  No evidence of DVT or superficial thrombosis. There appears to be a ruptured Baker's cyst in the left popliteal fossa.  Right:  Negative for DVT in the common femoral vein.

## 2017-01-02 ENCOUNTER — Other Ambulatory Visit: Payer: Self-pay

## 2017-01-02 ENCOUNTER — Encounter (HOSPITAL_COMMUNITY): Payer: Self-pay | Admitting: Emergency Medicine

## 2017-01-02 ENCOUNTER — Emergency Department (HOSPITAL_COMMUNITY)
Admission: EM | Admit: 2017-01-02 | Discharge: 2017-01-02 | Disposition: A | Discharge status: Home / Self Care | Payer: BLUE CROSS/BLUE SHIELD | Attending: Emergency Medicine | Admitting: Emergency Medicine

## 2017-01-02 DIAGNOSIS — Z79899 Other long term (current) drug therapy: Secondary | ICD-10-CM | POA: Diagnosis not present

## 2017-01-02 DIAGNOSIS — I10 Essential (primary) hypertension: Secondary | ICD-10-CM | POA: Diagnosis not present

## 2017-01-02 DIAGNOSIS — Z87891 Personal history of nicotine dependence: Secondary | ICD-10-CM | POA: Insufficient documentation

## 2017-01-02 DIAGNOSIS — J209 Acute bronchitis, unspecified: Secondary | ICD-10-CM | POA: Diagnosis not present

## 2017-01-02 DIAGNOSIS — J40 Bronchitis, not specified as acute or chronic: Secondary | ICD-10-CM

## 2017-01-02 DIAGNOSIS — E119 Type 2 diabetes mellitus without complications: Secondary | ICD-10-CM | POA: Insufficient documentation

## 2017-01-02 DIAGNOSIS — J029 Acute pharyngitis, unspecified: Secondary | ICD-10-CM | POA: Diagnosis present

## 2017-01-02 MED ORDER — BENZONATATE 100 MG PO CAPS: 100.0000 mg | ORAL_CAPSULE | Freq: Three times a day (TID) | ORAL | 0 refills | Status: AC

## 2017-01-02 MED ORDER — PREDNISONE 20 MG PO TABS
40.0000 mg | ORAL_TABLET | Freq: Every day | ORAL | 0 refills | Status: AC
Start: 2017-01-02 — End: ?

## 2017-01-02 MED ORDER — ALBUTEROL SULFATE HFA 108 (90 BASE) MCG/ACT IN AERS: 2.0000 | INHALATION_SPRAY | RESPIRATORY_TRACT | 2 refills | Status: AC | PRN

## 2017-01-02 NOTE — ED Triage Notes (Signed)
Pt complaint of productive cough, sore throat, and body aches since Wednesday.

## 2017-01-02 NOTE — ED Notes (Signed)
Bed: WTR5 Expected date:  Expected time:  Means of arrival:  Comments: 

## 2017-01-02 NOTE — ED Provider Notes (Signed)
[facility]-EMERGENCY DEPT Provider Note   CSN: [id number] Arrival date & time: 01/02/17  [id number]     History   Chief Complaint Chief Complaint  Patient presents with  . Sore    HPI Tara Stevens is a 46 y.o. female.  Patient presents to the emergency department for evaluation of chest congestion.  She reports that she has been sick for approximately 1 week.  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  She started with nasal congestion and sore throat, but now she has mostly coughing.  This morning she felt like she was having trouble breathing so she presented for evaluation.  She has not had any fever.      Past Medical History:  Diagnosis Date  . Diabetes mellitus without complication (HCC)    no meds since wt loss  . Hypertension   . Meniscus tear    left    There are no active problems to display for this patient.   Past Surgical History:  Procedure Laterality Date  . ABDOMINAL HYSTERECTOMY  2004   TAH  . CESAREAN SECTION    . HERNIA REPAIR  2004   Bilat IHR with TAH  . KNEE ARTHROSCOPY Right 08/09/2012   Procedure: RIGHT ARTHROSCOPY KNEE WITH PARTIAL MENISCECTOMY AND CHONDROPLASTY;  Surgeon: Mable ParisJustin William Chandler, MD;  Location: [facility];  Service: Orthopedics;  Laterality: Right;  . KNEE ARTHROSCOPY WITH MEDIAL MENISECTOMY Left 03/06/2014   Procedure: KNEE ARTHROSCOPY WITH MEDIAL MENISECTOMY;  Surgeon: Mable ParisJustin William Chandler, MD;  Location: [facility];  Service: Orthopedics;  Laterality: Left;  Left knee arthroscopy menisectomy, debridement, chondromalacia    OB History    No data available       Home Medications    Prior to Admission medications   Medication Sig Start Date End Date Taking? Authorizing Provider  albuterol (PROVENTIL HFA;VENTOLIN HFA) 108 (90 Base) MCG/ACT inhaler Inhale 2 puffs into the lungs every 4 (four) hours as needed for wheezing or shortness of breath.  01/02/17   Gilda CreasePollina, Christopher J, MD  benzonatate (TESSALON) 100 MG capsule Take 1 capsule (100 mg total) by mouth every 8 (eight) hours. 01/02/17   Gilda CreasePollina, Christopher J, MD  HYDROcodone-acetaminophen (NORCO) 5-325 MG per tablet Take 1 tablet by mouth every 6 (six) hours as needed for moderate pain. 03/06/14   Jiles HaroldLaliberte, Danielle, PA-C  olmesartan-hydrochlorothiazide (BENICAR HCT) 20-12.5 MG per tablet Take 1 tablet by mouth daily.    [provider]  phentermine 37.5 MG capsule Take 37.5 mg by mouth every morning.    [provider]  predniSONE (DELTASONE) 20 MG tablet Take 2 tablets (40 mg total) by mouth daily with breakfast. 01/02/17   Pollina, Canary Brimhristopher J, MD    Family History No family history on file.  Social History Social History   Tobacco Use  . Smoking status: Former Smoker    Last attempt to quit: 08/04/2002    Years since quitting: 14.4  Substance Use Topics  . Alcohol use: Yes    Comment: rare  . Drug use: No     Allergies   Penicillins   Review of Systems Review of Systems  HENT: Positive for congestion.   Respiratory: Positive for cough and shortness of breath.   All other systems reviewed and are negative.    Physical Exam Updated Vital Signs BP (!) 157/97 (BP Location: Right Arm)   Pulse 73   Temp 98 F (36.7 C) (Oral)  Resp 18   SpO2 99%   Physical Exam  Constitutional: She is oriented to person, place, and time. She appears well-developed and well-nourished. No distress.  HENT:  Head: Normocephalic and atraumatic.  Right Ear: Hearing normal.  Left Ear: Hearing normal.  Nose: Nose normal.  Mouth/Throat: Oropharynx is clear and moist and mucous membranes are normal.  Eyes: Conjunctivae and EOM are normal. Pupils are equal, round, and reactive to light.  Neck: Normal range of motion. Neck supple.  Cardiovascular: Regular rhythm, S1 normal and S2 normal. Exam reveals no gallop and no friction rub.  No murmur  heard. Pulmonary/Chest: Effort normal and breath sounds normal. No respiratory distress. She exhibits no tenderness.  Abdominal: Soft. Normal appearance and bowel sounds are normal. There is no hepatosplenomegaly. There is no tenderness. There is no rebound, no guarding, no tenderness at McBurney's point and negative Murphy's sign. No hernia.  Musculoskeletal: Normal range of motion.  Neurological: She is alert and oriented to person, place, and time. She has normal strength. No cranial nerve deficit or sensory deficit. Coordination normal. GCS eye subscore is 4. GCS verbal subscore is 5. GCS motor subscore is 6.  Skin: Skin is warm, dry and intact. No rash noted. No cyanosis.  Psychiatric: She has a normal mood and affect. Her speech is normal and behavior is normal. Thought content normal.  Nursing note and vitals reviewed.    ED Treatments / Results  Labs (all labs ordered are listed, but only abnormal results are displayed) Labs Reviewed - No data to display  EKG  EKG Interpretation None       Radiology No results found.  Procedures Procedures (including critical care time)  Medications Ordered in ED Medications - No data to display   Initial Impression / Assessment and Plan / ED Course  I have reviewed the triage vital signs and the nursing notes.  Pertinent labs & imaging results that were available during my care of the patient were reviewed by me and considered in my medical decision making (see chart for details).     Patient presents to the ER for evaluation of upper respiratory infection symptoms.  Examination is unremarkable.  She does not have any clinical signs of pneumonia.  Oxygenation is 99%.  She appears well.  No clinical concern for pneumonia.  She does not have any active wheezing, but did describe pain with coughing breathing earlier, likely inflammatory in nature.  Will treat with Tessalon Perles, albuterol.  Will give prescription for prednisone, but as  she has a history of diet-controlled diabetes, will be informed that she should not start it unless her symptoms worsen.  Final Clinical Impressions(s) / ED Diagnoses   Final diagnoses:  Bronchitis    ED Discharge Orders        Ordered    albuterol (PROVENTIL HFA;VENTOLIN HFA) 108 (90 Base) MCG/ACT inhaler  Every 4 hours PRN     01/02/17 0709    benzonatate (TESSALON) 100 MG capsule  Every 8 hours     01/02/17 0709    predniSONE (DELTASONE) 20 MG tablet  Daily with breakfast     01/02/17 0709       Gilda CreasePollina, Christopher J, MD 01/02/17 [phone]
